# Patient Record
Sex: Female | Born: 1945 | Race: Black or African American | Hispanic: No | Smoking: Never smoker
Health system: Southern US, Community
[De-identification: ages and names within clinical notes are randomized; demographics above are authoritative.]

## PROBLEM LIST (undated history)

## (undated) DIAGNOSIS — H409 Unspecified glaucoma: Secondary | ICD-10-CM

## (undated) DIAGNOSIS — E119 Type 2 diabetes mellitus without complications: Secondary | ICD-10-CM

## (undated) DIAGNOSIS — I10 Essential (primary) hypertension: Secondary | ICD-10-CM

## (undated) HISTORY — DX: Type 2 diabetes mellitus without complications: E11.9

## (undated) HISTORY — PX: CATARACT EXTRACTION, BILATERAL: SHX1313

## (undated) HISTORY — PX: ABDOMINAL HYSTERECTOMY: SHX81

## (undated) HISTORY — DX: Unspecified glaucoma: H40.9

## (undated) HISTORY — DX: Essential (primary) hypertension: I10

---

## 1999-08-14 ENCOUNTER — Other Ambulatory Visit: Admission: RE | Admit: 1999-08-14 | Discharge: 1999-08-14 | Payer: Self-pay | Admitting: Obstetrics and Gynecology

## 1999-09-26 ENCOUNTER — Encounter (INDEPENDENT_AMBULATORY_CARE_PROVIDER_SITE_OTHER): Payer: Self-pay | Admitting: Specialist

## 1999-09-26 ENCOUNTER — Other Ambulatory Visit: Admission: RE | Admit: 1999-09-26 | Discharge: 1999-09-26 | Payer: Self-pay | Admitting: Internal Medicine

## 1999-11-29 ENCOUNTER — Encounter: Payer: Self-pay | Admitting: Family Medicine

## 1999-11-29 ENCOUNTER — Ambulatory Visit (HOSPITAL_COMMUNITY): Admission: RE | Admit: 1999-11-29 | Discharge: 1999-11-29 | Payer: Self-pay | Admitting: Family Medicine

## 2000-04-21 ENCOUNTER — Encounter: Payer: Self-pay | Admitting: Family Medicine

## 2000-04-21 ENCOUNTER — Ambulatory Visit (HOSPITAL_COMMUNITY): Admission: RE | Admit: 2000-04-21 | Discharge: 2000-04-21 | Payer: Self-pay | Admitting: Family Medicine

## 2000-12-07 ENCOUNTER — Other Ambulatory Visit: Admission: RE | Admit: 2000-12-07 | Discharge: 2000-12-07 | Payer: Self-pay | Admitting: *Deleted

## 2001-12-29 ENCOUNTER — Other Ambulatory Visit: Admission: RE | Admit: 2001-12-29 | Discharge: 2001-12-29 | Payer: Self-pay | Admitting: Obstetrics and Gynecology

## 2002-02-09 ENCOUNTER — Encounter: Payer: Self-pay | Admitting: Family Medicine

## 2002-02-09 ENCOUNTER — Encounter: Admission: RE | Admit: 2002-02-09 | Discharge: 2002-02-09 | Payer: Self-pay | Admitting: Family Medicine

## 2003-12-12 ENCOUNTER — Other Ambulatory Visit: Admission: RE | Admit: 2003-12-12 | Discharge: 2003-12-12 | Payer: Self-pay | Admitting: Obstetrics and Gynecology

## 2004-09-02 ENCOUNTER — Encounter: Admission: RE | Admit: 2004-09-02 | Discharge: 2004-09-02 | Payer: Self-pay | Admitting: Family Medicine

## 2004-10-18 ENCOUNTER — Ambulatory Visit: Payer: Self-pay | Admitting: Internal Medicine

## 2004-10-29 ENCOUNTER — Ambulatory Visit: Payer: Self-pay | Admitting: Internal Medicine

## 2005-01-29 ENCOUNTER — Encounter: Admission: RE | Admit: 2005-01-29 | Discharge: 2005-04-29 | Payer: Self-pay | Admitting: Family Medicine

## 2006-06-02 ENCOUNTER — Encounter: Admission: RE | Admit: 2006-06-02 | Discharge: 2006-06-02 | Payer: Self-pay | Admitting: Family Medicine

## 2006-06-04 ENCOUNTER — Encounter: Admission: RE | Admit: 2006-06-04 | Discharge: 2006-06-04 | Payer: Self-pay | Admitting: Family Medicine

## 2007-03-15 ENCOUNTER — Encounter: Admission: RE | Admit: 2007-03-15 | Discharge: 2007-03-15 | Payer: Self-pay | Admitting: Family Medicine

## 2007-04-21 ENCOUNTER — Ambulatory Visit (HOSPITAL_COMMUNITY): Admission: RE | Admit: 2007-04-21 | Discharge: 2007-04-21 | Payer: Self-pay | Admitting: Gastroenterology

## 2007-04-21 ENCOUNTER — Encounter (INDEPENDENT_AMBULATORY_CARE_PROVIDER_SITE_OTHER): Payer: Self-pay | Admitting: Gastroenterology

## 2007-05-24 ENCOUNTER — Encounter: Admission: RE | Admit: 2007-05-24 | Discharge: 2007-05-24 | Payer: Self-pay | Admitting: Obstetrics and Gynecology

## 2007-05-26 ENCOUNTER — Ambulatory Visit (HOSPITAL_COMMUNITY): Admission: RE | Admit: 2007-05-26 | Discharge: 2007-05-27 | Payer: Self-pay | Admitting: Obstetrics and Gynecology

## 2007-05-26 ENCOUNTER — Encounter (INDEPENDENT_AMBULATORY_CARE_PROVIDER_SITE_OTHER): Payer: Self-pay | Admitting: Obstetrics and Gynecology

## 2010-12-01 ENCOUNTER — Encounter: Payer: Self-pay | Admitting: Family Medicine

## 2010-12-01 ENCOUNTER — Encounter: Payer: Self-pay | Admitting: Obstetrics and Gynecology

## 2011-03-25 NOTE — Op Note (Signed)
NAMEAMELIANNA, Ashlee Wong         ACCOUNT NO.:  [id number]   MEDICAL RECORD NO.:  [id number]          PATIENT TYPE:  OIB   LOCATION:  9315                          FACILITY:  [facility]   PHYSICIAN:  Maxie Better, M.D.DATE OF BIRTH:  14-May-1946   DATE OF PROCEDURE:  05/26/2007  DATE OF DISCHARGE:                               OPERATIVE REPORT   PREOPERATIVE DIAGNOSIS:  Chronic left lower quadrant pain,  uterine  fibroids.   PROCEDURE:  Laparoscopic total hysterectomy with vaginal closure, and  repair of vaginal laceration.   POSTOPERATIVE DIAGNOSIS:  Chronic left lower quadrant pain, uterine  fibroids.   ANESTHESIA:  General.   SURGEON:  Maxie Better, M.D.   ASSISTANT:  Gerri Spore B. Earlene Plater, M.D.   PROCEDURE:  Under adequate general anesthesia the patient is placed in  the dorsal lithotomy position.  She was sterilely prepped and draped in  usual fashion.  An indwelling Foley catheter was placed.  Examination  under anesthesia revealed a slightly enlarged anteverted uterus,  irregular, no adnexal masses could be appreciated.  A bivalve speculum  placed in the vagina.  The vagina was atrophic, cervix that was a  pinpoint os, anterior lip of the cervix was grasped with a single-tooth  tenaculum.  The cervix was then serially dilated up to #23 dilator.  The  uterus was then sounded to 8 cm. The smallest Rumi cap for the cervix  was then placed and the balloon inflated intracavitary. The cervix was  placed within the Rumi, the uterine manipulator cap. The balloon was  then inflated in the vagina for seal. Attention was then turned to the  abdomen where an infraumbilical incision was then made, carried down to  the rectus fascia.  Rectus fascia was opened vertically and the parietal  peritoneum subsequently opened. Omental fat was encountered and  displaced. The Hasson cannula was introduced and a lighted video  laparoscope was put through that port.  The fascia was pursestring  with  0 Vicryl to facilitate the subsequent closure of the incision.  Inspection of the abdomen showed an atraumatic entry into the abdomen  itself. The liver edge was noted be normal. A 10 mm site was placed in  the left lower quadrant and in the right  lower side , the 5 mm port  incision was then made and a 5 mm port was placed.  All incisions had  been pre injected with quarter percent Marcaine. With this ports in  place the pelvis was inspected. Both ureters were seen to be  peristalsing deep in the pelvis.  Both ovaries and tubes were noted to  be normal.  The  uterus was irregular, consistent with fibroids.  The  patient had a previous cesarean section.  There was clearly evidence of  some adhesions of where the bladder reflection was.  The procedure was  started on the left. The infundibulopelvic ligament was then serially  cauterized and cut and subsequently the round ligament was cauterized  and severed. The anterior leaf of the bladder was then attempted to be  opened on the side there were adhesions noted and careful dissection was  continued.  On the right the round ligament was cauterized, cut. The  right infundibulopelvic ligament was also then serially cauterized and  cut until the uterine vessels were then reached. The bladder was tented.  Sharp dissection and cauterization was then used to try to open the  displace the bladder inferiorly and this was performed. Much adhesions  scar tissue was noted on the left half of the bladder flap.  The uterine  vessels were subsequently identified on the right, clamped, cauterized  and then severed. The bladder was placed and displaced inferiorly.  The  part of the anterior cul-de-sac was then opened with evidence of the  Rumi being seen and subsequently the left side of the bladder peritoneum  was cauterized, cut and dissected. In the process the uterine vessels on  the left for then also cauterized.  The hot scissors was then  utilized  to further separate the cervical attachment to the vagina posteriorly  and then the uterus was then pulled into the top of the vagina.  Attention was then turned back to the vagina where the Rumi apparatus  was removed.  The cervix was then grasped with single-tooth tenaculum  x2, placed on traction. There was evidence that the posterior aspect of  the vagina had a zigzag laceration thought to be probably secondary to  the narrowness of the vagina combined with the atrophic vaginitis  leading to trauma when the manipulator was utilized.  Nonetheless the  uterus was grasped, bivalved and the fibroids removed from the  intracavitary area and continued removal of the fibroids until the  uterus was decompressed and then was able to be pulled out along with  the tubes and ovaries bilaterally.  Decision was made since I was there  to then close the vaginal cuff since it was easily identified.  It was  closed with interrupted figure-of-eight sutures transversely.  The  posterior laceration was inspected.  Did not show evidence of the rectum  being compromised and therefore 2-0 Vicryl sutures were then placed to  approximate the defect.  Because of the atrophic changes in the vagina,  estrogen cream and a vaginal pack was then placed in the vagina.  Attention was then turned back to the abdomen which was reinsufflated.  Abdomen was irrigated, suctioned.  Good hemostasis noted.  Small  bleeders cauterized.  The abdomen was deflated in order to assess for  any bleeders.  None was seen and at that point the procedure was said to  be complete. The lower ports were removed under direct visualization.  The upper port was also removed under direct visualization.  The  pursestring of the rectus fascia was closed infraumbilically and the  skin at the incisions were approximated with 4-0 Vicryl subcuticular  stitches.   SPECIMENS:  Uterus with the fibroid, cervix, tubes and ovaries sent to   pathology.   ESTIMATED BLOOD LOSS:  Was 150 mL.   COMPLICATION:  Was the vaginal laceration.   INTRAOPERATIVE FLUID:  Was 2800 mL.   URINE OUTPUT:  Was 300 mL clear yellow urine.  Sponge and instruments  counts x2 was correct. The patient tolerated the procedure well and was  transferred to recovery in stable condition.      Maxie Better, M.D.  Electronically Signed     [place]/MEDQ  D:  05/26/2007  T:  05/27/2007  Job:  [id number]

## 2011-03-25 NOTE — Op Note (Signed)
Ashlee Wong, Ashlee Wong         ACCOUNT NO.:  [id number]   MEDICAL RECORD NO.:  [id number]          PATIENT TYPE:  AMB   LOCATION:  ENDO                         FACILITY:  [facility]   PHYSICIAN:  Anselmo Rod, M.D.  DATE OF BIRTH:  [birth year]   DATE OF PROCEDURE:  04/21/2007  DATE OF DISCHARGE:                               OPERATIVE REPORT   PROCEDURE PERFORMED:  Colonoscopy with multiple cold biopsies.   ENDOSCOPIST:  Anselmo Rod, M.D.   INSTRUMENT USED:  Pentax video colonoscopy.   INDICATION FOR PROCEDURE:  [age]-year-old African American female  undergoing screening colonoscopy.  The patient has a history of left  lower quadrant pain, rule out colon polyps, masses, etc.   PREPROCEDURE PREPARATION:  Informed consent was procured from the  patient.  The patient was fasted for 4 hours prior to the procedure and  prepped with MoviPrep the night of and on the morning of the procedure.  Risks and benefits of the procedure including a 10% miss rate of cancer  in polyps were discussed with the patient as well.   PREPROCEDURE PHYSICAL:  VITAL SIGNS:  The patient had stable vital  signs.  NECK:  Supple.  CHEST:  Clear to auscultation.  CARDIAC:  S1 and S2 regular.  ABDOMEN:  Soft with normal bowel sounds.   DESCRIPTION OF THE PROCEDURE:  The patient was placed in the left  lateral decubitus position and sedated with 100 mcg of Fentanyl and 10  mg of Versed given intravenously in slow incremental doses.  Once the  patient was adequately sedate and maintained on low-flow oxygen and  continuous cardiac monitoring, the Olympus video colonoscope was  advanced from the rectum to the cecum.  Whitish plaque-like lesions were  seen in the cecum and the proximal right colon of unclear significance.  Another small sessile polyp was biopsied from the mid right colon.  There was no evidence of diverticulosis.  Retroflexion in the rectum  revealed no abnormalities.  The patient tolerated  the procedure well  without immediate complications.   IMPRESSION:  1. Whitish plaques in proximal right colon and cecum, multiple      biopsies done, results pending.  2. A small sessile polyp biopsied from the mid right colon.  3. No evidence of diverticulosis.   RECOMMENDATIONS:  1. Await pathology results.  2. Avoid all nonsteroidals including aspirin for the next 4 weeks.  3. Outpatient followup in the next 2 weeks for further      recommendations.      Anselmo Rod, M.D.  Electronically Signed     JNM/MEDQ  D:  04/22/2007  T:  04/23/2007  Job:  [id number]   cc:   Maxie Better, M.D.  Fax: [phone]   Renaye Rakers, M.D.  Fax: [phone]

## 2011-06-10 ENCOUNTER — Other Ambulatory Visit (HOSPITAL_COMMUNITY): Payer: Self-pay | Admitting: Family Medicine

## 2011-06-10 DIAGNOSIS — J392 Other diseases of pharynx: Secondary | ICD-10-CM

## 2011-06-19 ENCOUNTER — Encounter (HOSPITAL_COMMUNITY)
Admission: RE | Admit: 2011-06-19 | Discharge: 2011-06-19 | Disposition: A | Discharge status: Home / Self Care | Payer: BC Managed Care – PPO | Source: Ambulatory Visit | Attending: Family Medicine | Admitting: Family Medicine

## 2011-06-19 DIAGNOSIS — J392 Other diseases of pharynx: Secondary | ICD-10-CM | POA: Insufficient documentation

## 2011-06-20 ENCOUNTER — Encounter (HOSPITAL_COMMUNITY)
Admission: RE | Admit: 2011-06-20 | Discharge: 2011-06-20 | Disposition: A | Discharge status: Home / Self Care | Payer: BC Managed Care – PPO | Source: Ambulatory Visit | Attending: Family Medicine | Admitting: Family Medicine

## 2011-06-20 DIAGNOSIS — R220 Localized swelling, mass and lump, head: Secondary | ICD-10-CM | POA: Insufficient documentation

## 2011-06-20 MED ORDER — SODIUM PERTECHNETATE TC 99M INJECTION
10.0000 | Freq: Once | INTRAVENOUS | Status: AC | PRN
  Administered 2011-06-20: 11 via INTRAVENOUS

## 2011-06-20 MED ORDER — SODIUM IODIDE I 131 CAPSULE
12.0600 | Freq: Once | INTRAVENOUS | Status: AC | PRN
  Administered 2011-06-19: 12.06 via ORAL

## 2011-07-23 ENCOUNTER — Other Ambulatory Visit (HOSPITAL_COMMUNITY): Payer: Self-pay | Admitting: Family Medicine

## 2011-07-23 DIAGNOSIS — E049 Nontoxic goiter, unspecified: Secondary | ICD-10-CM

## 2011-07-29 ENCOUNTER — Ambulatory Visit (HOSPITAL_COMMUNITY)
Admission: RE | Admit: 2011-07-29 | Discharge: 2011-07-29 | Disposition: A | Discharge status: Home / Self Care | Payer: BC Managed Care – PPO | Source: Ambulatory Visit | Attending: Family Medicine | Admitting: Family Medicine

## 2011-07-29 DIAGNOSIS — R220 Localized swelling, mass and lump, head: Secondary | ICD-10-CM | POA: Insufficient documentation

## 2011-07-29 DIAGNOSIS — E049 Nontoxic goiter, unspecified: Secondary | ICD-10-CM

## 2011-08-25 ENCOUNTER — Other Ambulatory Visit: Payer: Self-pay | Admitting: Family Medicine

## 2011-08-25 DIAGNOSIS — E041 Nontoxic single thyroid nodule: Secondary | ICD-10-CM

## 2011-08-25 LAB — BASIC METABOLIC PANEL
BUN: 6
CO2: 28
Calcium: 8.6
Creatinine, Ser: 0.56
GFR calc Af Amer: >60
GFR calc non Af Amer: >60

## 2011-08-25 LAB — CBC
Hemoglobin: 11.2 — ABNORMAL LOW
MCHC: 33.9
Platelets: 343
WBC: 15.2 — ABNORMAL HIGH

## 2011-08-26 LAB — CBC: WBC: 7

## 2011-08-27 ENCOUNTER — Other Ambulatory Visit (HOSPITAL_COMMUNITY)
Admission: RE | Admit: 2011-08-27 | Discharge: 2011-08-27 | Disposition: A | Discharge status: Home / Self Care | Payer: BC Managed Care – PPO | Source: Ambulatory Visit | Attending: Interventional Radiology | Admitting: Interventional Radiology

## 2011-08-27 ENCOUNTER — Ambulatory Visit
Admission: RE | Admit: 2011-08-27 | Discharge: 2011-08-27 | Disposition: A | Discharge status: Home / Self Care | Payer: BC Managed Care – PPO | Source: Ambulatory Visit | Attending: Family Medicine | Admitting: Family Medicine

## 2011-08-27 DIAGNOSIS — E041 Nontoxic single thyroid nodule: Secondary | ICD-10-CM

## 2011-08-27 DIAGNOSIS — E049 Nontoxic goiter, unspecified: Secondary | ICD-10-CM | POA: Insufficient documentation

## 2011-12-03 ENCOUNTER — Encounter: Payer: Self-pay | Admitting: Internal Medicine

## 2012-04-20 ENCOUNTER — Other Ambulatory Visit: Payer: Self-pay | Admitting: Internal Medicine

## 2012-04-20 DIAGNOSIS — E041 Nontoxic single thyroid nodule: Secondary | ICD-10-CM

## 2012-04-26 ENCOUNTER — Ambulatory Visit (HOSPITAL_COMMUNITY)
Admission: RE | Admit: 2012-04-26 | Discharge: 2012-04-26 | Disposition: A | Discharge status: Home / Self Care | Payer: Medicare Other | Source: Ambulatory Visit | Attending: Internal Medicine | Admitting: Internal Medicine

## 2012-04-26 DIAGNOSIS — E049 Nontoxic goiter, unspecified: Secondary | ICD-10-CM | POA: Insufficient documentation

## 2012-04-26 DIAGNOSIS — E041 Nontoxic single thyroid nodule: Secondary | ICD-10-CM

## 2012-06-20 ENCOUNTER — Ambulatory Visit (HOSPITAL_BASED_OUTPATIENT_CLINIC_OR_DEPARTMENT_OTHER): Payer: Medicare Other | Attending: Cardiology

## 2012-06-20 VITALS — Ht 67.0 in | Wt 193.0 lb

## 2012-06-20 DIAGNOSIS — G4733 Obstructive sleep apnea (adult) (pediatric): Secondary | ICD-10-CM

## 2012-06-20 DIAGNOSIS — R0989 Other specified symptoms and signs involving the circulatory and respiratory systems: Secondary | ICD-10-CM | POA: Insufficient documentation

## 2012-06-20 DIAGNOSIS — R0609 Other forms of dyspnea: Secondary | ICD-10-CM | POA: Insufficient documentation

## 2012-06-20 DIAGNOSIS — G4710 Hypersomnia, unspecified: Secondary | ICD-10-CM | POA: Insufficient documentation

## 2012-06-27 DIAGNOSIS — R0609 Other forms of dyspnea: Secondary | ICD-10-CM

## 2012-06-27 DIAGNOSIS — R0989 Other specified symptoms and signs involving the circulatory and respiratory systems: Secondary | ICD-10-CM

## 2012-06-27 DIAGNOSIS — G4710 Hypersomnia, unspecified: Secondary | ICD-10-CM

## 2012-06-27 DIAGNOSIS — G4730 Sleep apnea, unspecified: Secondary | ICD-10-CM

## 2012-06-28 NOTE — Procedures (Signed)
NAME:  Ashlee Wong, Ashlee Wong     ACCOUNT NO.:  [id number]  MEDICAL RECORD NO.:  [id number]          PATIENT TYPE:  OUT  LOCATION:  SLEEP CENTER                 FACILITY:  [facility]  PHYSICIAN:  Clinton D. Maple Hudson, MD, FCCP, FACPDATE OF BIRTH:  [birth year] 04, 1946  DATE OF STUDY:                           NOCTURNAL POLYSOMNOGRAM  REFERRING PHYSICIAN:  Pamella Pert, MD  INDICATION FOR STUDY:  Insomnia with sleep apnea.  EPWORTH SLEEPINESS SCORE:  11/24.  BMI 30.2, weight 193 pounds, height 67 inches, neck 14 inches.  MEDICATIONS:  Home medications are charted and reviewed.  SLEEP ARCHITECTURE:  Total sleep time 358.5 minutes with sleep efficiency 84.9%.  Stage I was 8.5%.  Stage II 76.7%.  Stage III absent, REM 14.8% of total sleep time.  Sleep latency 25 minutes.  REM latency 102.5 minutes, awake after sleep onset 39.5 minutes.  Arousal index 9.4.  Bedtime Medication:  None.  RESPIRATORY DATA:  Apnea-hypopnea index (AHI) 2.5 per hour.  A total of 15 events were scored, including 12 obstructive apneas, 2 central apneas, 1 hypopnea.  Events were associated with non-supine sleep position and REM.  REM AHI 10.2 per hour.  There were insufficient numbers of events to permit application of split protocol, CPAP titration on this study night.  OXYGEN DATA:  Moderately loud snoring with oxygen desaturation to a nadir of 85% and mean oxygen saturation through the study of 92.9% on room air.  CARDIAC DATA:  Sinus rhythm.  MOVEMENT-PARASOMNIA:  A total of 32 limb jerks were counted, of which 4 were associated with arousals or awakenings for periodic limb movement with arousal index of 0.7 per hour.  No bathroom trips.  IMPRESSION-RECOMMENDATIONS: 1. Occasional respiratory event with sleep disturbance, within normal     limits.  AHI 2.5 per hour (the normal range for adults is from 0-5     events per hour).  Moderately loud snoring with oxygen desaturation     to a nadir of 85% and mean  oxygen saturation through the study of     92.9% on room air. 2. There were not enough respiratory events on this study night to     qualify for application of split CPAP titration protocol, and     scores in this range would not usually be treated with CPAP.     Clinton D. Maple Hudson, MD, [facility], FACP Diplomate, American Board of Sleep Medicine    CDY/MEDQ  D:  06/27/2012 13:38:35  T:  06/28/2012 03:54:29  Job:  [id number]

## 2012-07-22 ENCOUNTER — Encounter: Payer: Self-pay | Admitting: Pulmonary Disease

## 2012-07-22 ENCOUNTER — Ambulatory Visit (INDEPENDENT_AMBULATORY_CARE_PROVIDER_SITE_OTHER): Payer: Medicare Other | Admitting: Pulmonary Disease

## 2012-07-22 VITALS — BP 110/56 | HR 58 | Temp 97.6°F | Ht 67.0 in | Wt 193.2 lb

## 2012-07-22 DIAGNOSIS — G4730 Sleep apnea, unspecified: Secondary | ICD-10-CM

## 2012-07-22 DIAGNOSIS — Z23 Encounter for immunization: Secondary | ICD-10-CM

## 2012-07-22 NOTE — Assessment & Plan Note (Signed)
She has snoring, sleep disruption, and daytime sleepiness.  She has a history of hypertension and diabetes.  While her overall AHI was in normal range, she did have elevated AHI in REM sleep.    I have reviewed the sleep test results with the patient.  Explained how sleep apnea can affect the patient's health.  Driving precautions and importance of weight loss were discussed.  Treatment options for sleep apnea were reviewed.  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  She would like to try weight reduction first.  Will f/u in 6 months.  Will then decide if she needs to pursue specific therapy for sleep apnea.

## 2012-07-22 NOTE — Progress Notes (Deleted)
  Subjective:    Patient ID: MATRICIA BEGNAUD, female    DOB: 07/01/1946, 66 y.o.   MRN: [id number]  HPI    Review of Systems  Constitutional: Negative for fever and unexpected weight change.  HENT: Positive for ear pain and dental problem. Negative for congestion, sore throat, sneezing and trouble swallowing.   Respiratory: Positive for shortness of breath. Negative for cough.   Cardiovascular: Negative for chest pain, palpitations and leg swelling.  Gastrointestinal: Negative for abdominal pain.  Musculoskeletal: Negative for joint swelling.  Skin: Negative for rash.  Neurological: Negative for headaches.  Psychiatric/Behavioral: Positive for dysphoric mood. The patient is not nervous/anxious.        Objective:   Physical Exam        Assessment & Plan:

## 2012-07-22 NOTE — Patient Instructions (Signed)
Follow up in 6 months 

## 2012-07-22 NOTE — Progress Notes (Signed)
Chief Complaint  Patient presents with  . Advice Only    Had sleep srudy aug 2013- never received the results.    CC: Yates Decamp  History of Present Illness: Ashlee Wong is a 66 y.o. female for evaluation of sleep problems.  She had sleep study 8/11//13>>AHI 2.5, SpO2 85%, PLMI 0.7, REM AHI 10.2.  She did not have supine sleep during the study.  She has noticed trouble with her sleep for years.  This has been getting worse.  She currently lives alone.  She has been told that she snores.    She goes to bed at 10 pm, but watches TV for about an hour in bed.  She will then sleep until about 3 am.  She wakes up to use the bathroom, but then won't fall back to sleep for another hour.  She gets out of bed at 6 am.  She feels tired in the morning, but denies headaches.  She is not using anything to help her sleep or stay awake.  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  She was told she has a thyroid nodule.  She denies surgery on her nose or tonsils.  There is no history of smoking or alcohol use.  She can fall asleep easily if she is sitting quiet.  The patient denies sleep walking, sleep talking, bruxism, or nightmares.  There is no history of restless legs.  The patient denies sleep hallucinations, sleep paralysis, or cataplexy.  Her Epworth score is 12 out of 24.  Past Medical History  Diagnosis Date  . Glaucoma   . Hypertension   . Type 2 diabetes mellitus     Past Surgical History  Procedure Date  . Abdominal hysterectomy   . Cataract extraction, bilateral     Current Outpatient Prescriptions on File Prior to Visit  Medication Sig Dispense Refill  . linagliptin (TRADJENTA) 5 MG TABS tablet Take 5 mg by mouth daily.      . nebivolol (BYSTOLIC) 5 MG tablet Take 5 mg by mouth daily.        No Known Allergies  Family History  Problem Relation Age of Onset  . Heart disease Father   . Cancer Mother     History  Substance Use Topics  .  Smoking status: Never Smoker   . Smokeless tobacco: Not on file  . Alcohol Use: No    Review of Systems  Constitutional: Negative for fever and unexpected weight change.  HENT: Positive for ear pain and dental problem. Negative for congestion, sore throat, sneezing and trouble swallowing.   Respiratory: Positive for shortness of breath. Negative for cough.  Cardiovascular: Negative for chest pain, palpitations and leg swelling.  Gastrointestinal: Negative for abdominal pain.  Musculoskeletal: Negative for joint swelling.  Skin: Negative for rash.  Neurological: Negative for headaches.  Psychiatric/Behavioral: Positive for dysphoric mood. The patient is not nervous/anxious.    Physical Exam: Filed Vitals:   07/22/12 1354 07/22/12 1356  BP:  110/56  Pulse:  58  Temp: 97.6 F (36.4 C)   TempSrc: Oral   Height: 5\' 7"  (1.702 m)   Weight: 193 lb 3.2 oz (87.635 kg)   SpO2:  96%  ,  Current Encounter SPO2  07/22/12 1356 96%    Wt Readings from Last 3 Encounters:  07/22/12 193 lb 3.2 oz (87.635 kg)  06/20/12 193 lb (87.544 kg)    Body mass index is 30.26 kg/(m^2).   General - No  distress ENT - TM clear, no sinus tenderness, no oral exudate, MP 4, scalloped tongue border, no LAN, no thyromegaly Cardiac - s1s2 regular, no murmur, pulses symmetric, no edema Chest - normal respiratory excursion, good air entry, no wheeze/rales/dullness Back - no focal tenderness Abd - soft, non-tender, no organomegaly, + bowel sounds Ext - normal motor strength Neuro - Cranial nerves are normal. PERLA. EOM's intact. Skin - no discernible active dermatitis, erythema, urticaria or inflammatory process. Psych - normal mood, and behavior.   Lab Results  Component Value Date   WBC 15.2* 05/27/2007   HGB 11.2* 05/27/2007   HCT 33.0* 05/27/2007   MCV 81.9 05/27/2007   PLT 343 05/27/2007    Lab Results  Component Value Date   CREATININE 0.56 05/27/2007   BUN 6 05/27/2007   NA 139 05/27/2007   K  3.7 05/27/2007   CL 107 05/27/2007   CO2 28 05/27/2007     Assessment/Plan:  Coralyn Helling, MD [facility] Pager:  [phone] 07/22/2012, 2:05 PM

## 2012-07-29 ENCOUNTER — Encounter: Payer: Self-pay | Admitting: Internal Medicine

## 2013-01-18 ENCOUNTER — Telehealth: Payer: Self-pay | Admitting: Pulmonary Disease

## 2013-01-18 NOTE — Telephone Encounter (Signed)
Attempted to call pt x 3 to make next ov per recall.  Pt never returned calls.  Mailed recall letter 01/19/13. Emily E McAlister °

## 2013-04-28 IMAGING — US US SOFT TISSUE HEAD/NECK
1 series · 14 of 25 positions shown · non-contrast
Comparison: Scintigraphy 06/20/2011

CLINICAL DATA: Neck swelling.  Normal scintigraphy.

THYROID ULTRASOUND
TECHNIQUE: Ultrasound examination of the thyroid gland and adjacent
soft tissues was performed.

[Series 1: us soft tissue head/neck · 0.07mm/px · 14 of 66 slices shown]
[im 1/66]
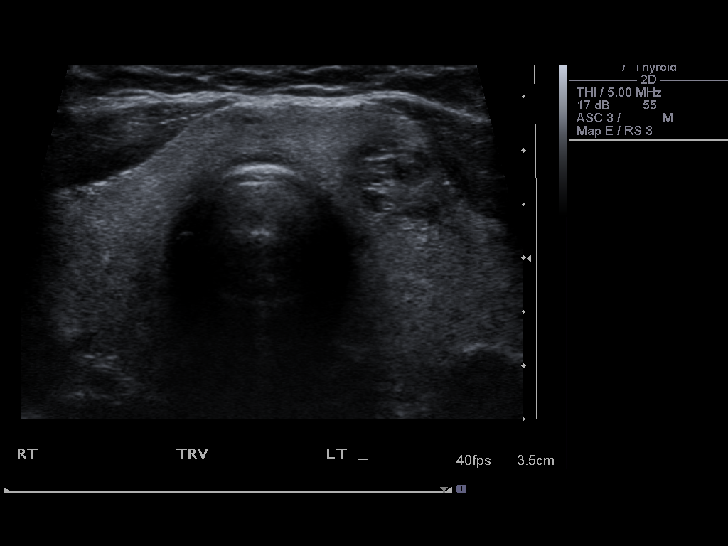
[im 6/66]
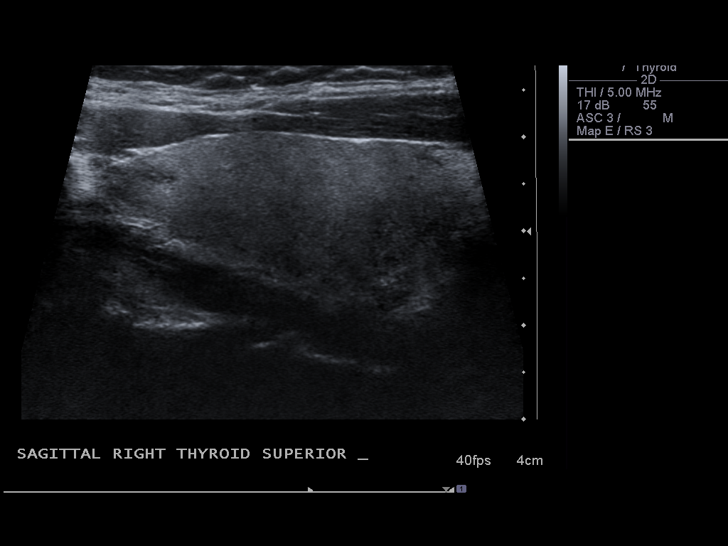
[im 11/66]
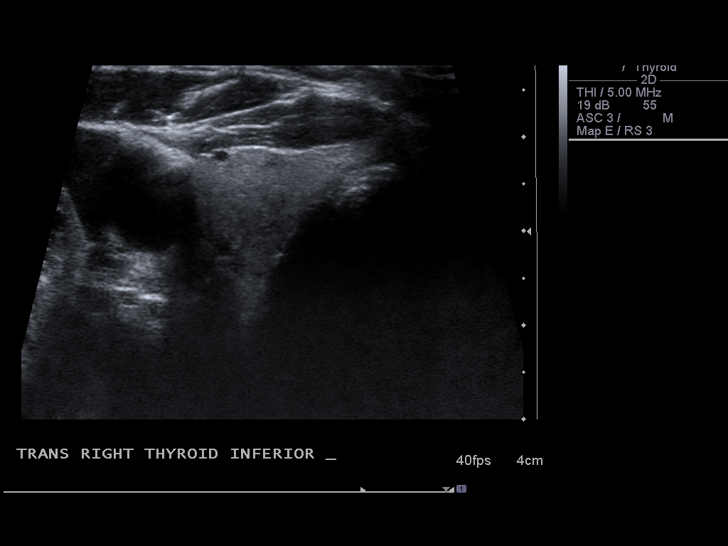
[im 17/66]
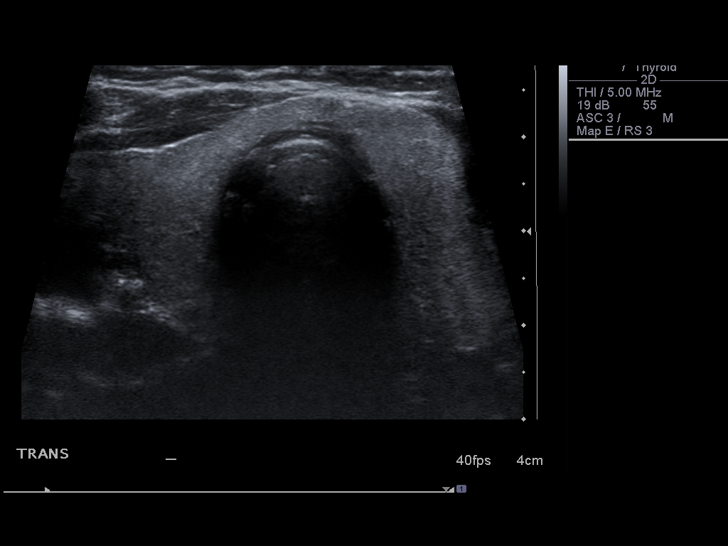
[im 22/66]
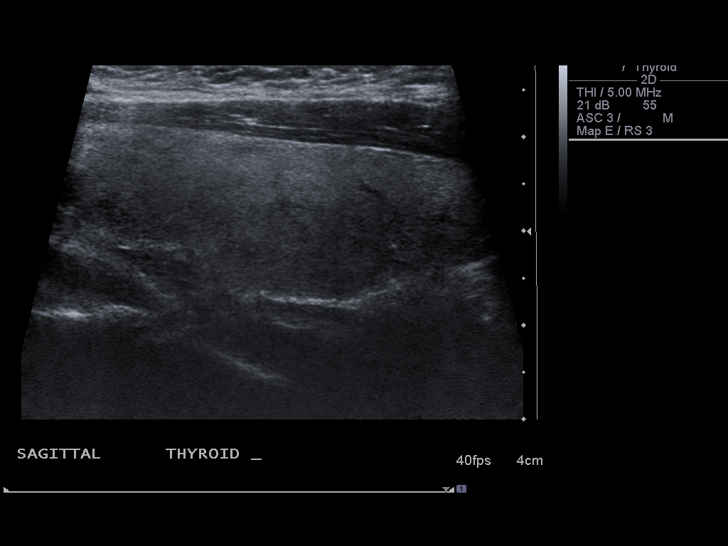
[im 25/66]
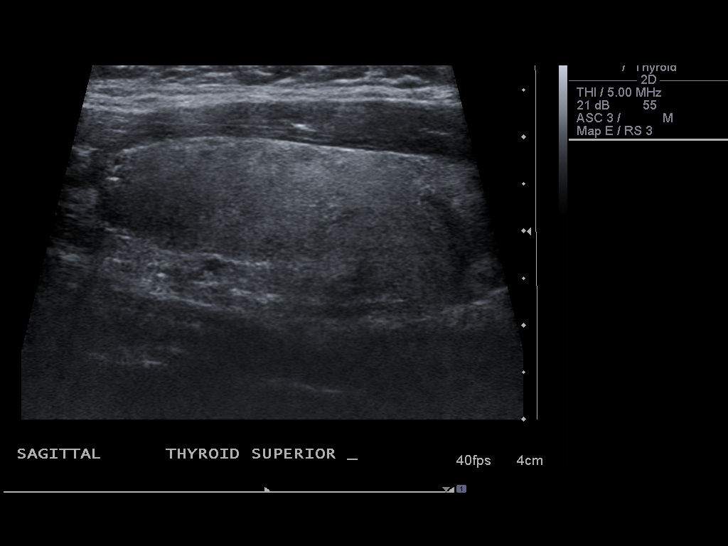
[im 30/66]
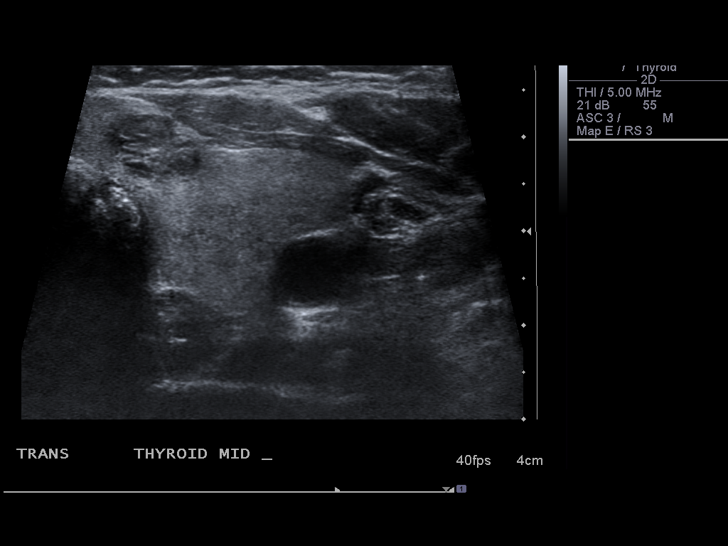
[im 36/66]
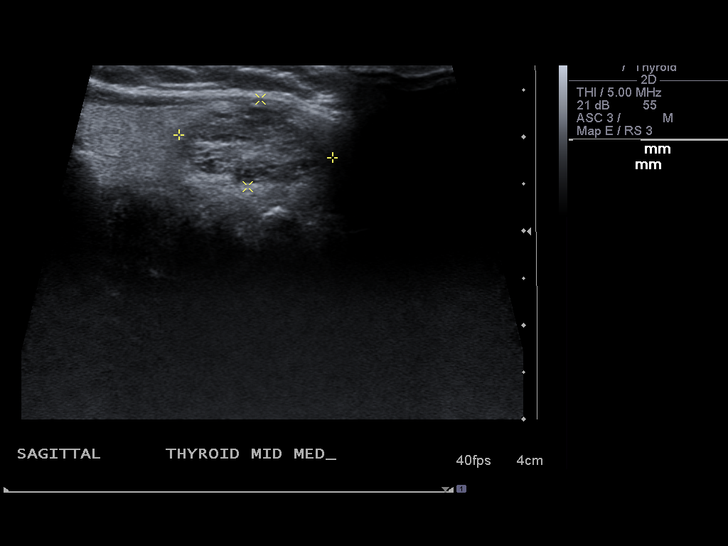
[im 41/66]
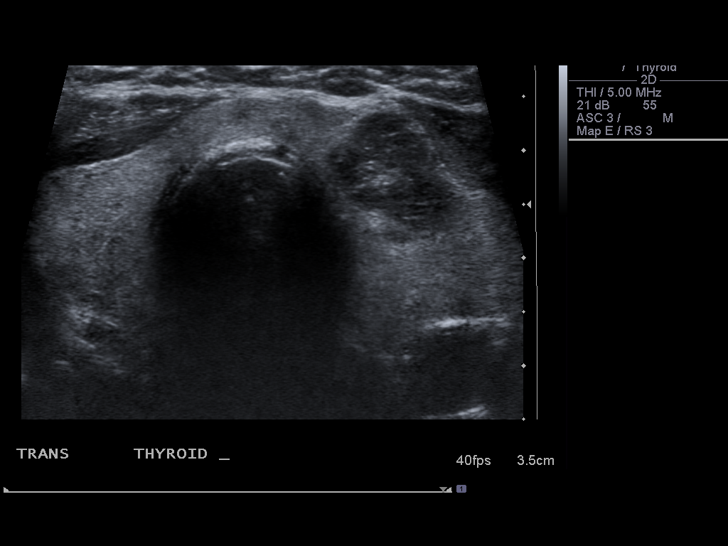
[im 44/66]
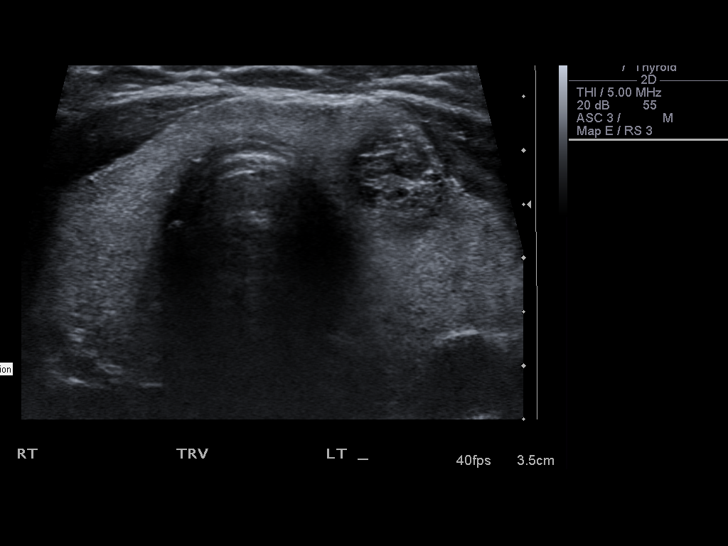
[im 49/66]
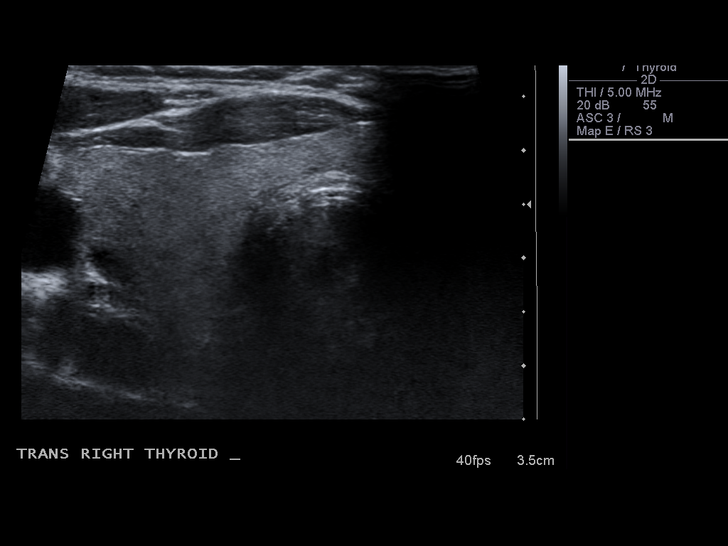
[im 55/66]
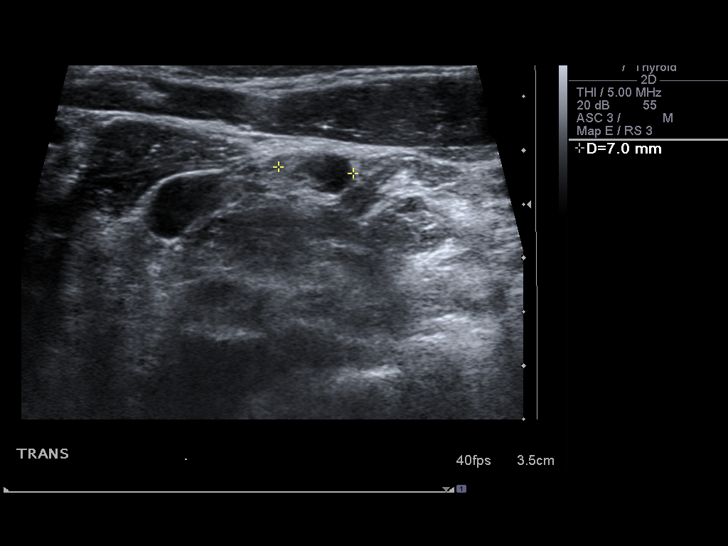
[im 60/66]
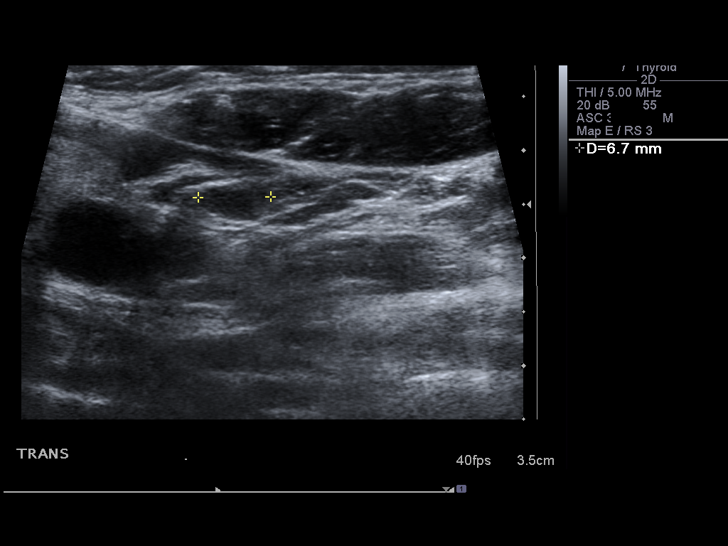
[im 66/66]
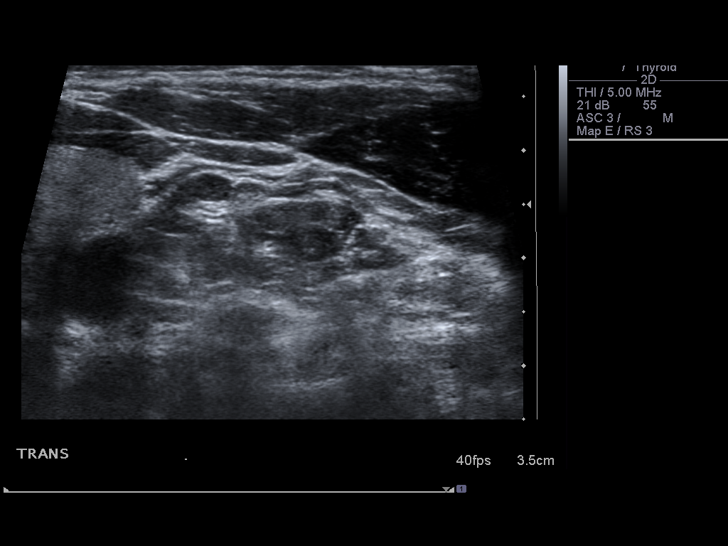

[14 of 25 positions shown; findings below may reference images not displayed]

FINDINGS: Right thyroid lobe:  14 x 17 x 41 mm, inhomogeneous
Left thyroid lobe:  17 x 23 x 48 mm
Isthmus:  4.4 mm in thickness

Focal nodules:
9 x 13 x 17 mm solid, medial left

Lymphadenopathy:  None visualized. Several sub centimeter normal
appearing cervical nodes are noted bilaterally.
IMPRESSION: 1.  17 mm solitary left thyroid nodule.  Consider FNA biopsy to
exclude neoplasm. This recommendation follows the consensus
statement:  Management of Thyroid Nodules Detected as US:  Society
of Radiologists in Ultrasound Consensus Conference Statement.
[URL]

## 2014-10-04 ENCOUNTER — Other Ambulatory Visit: Payer: Self-pay | Admitting: Family Medicine

## 2014-10-04 DIAGNOSIS — R609 Edema, unspecified: Secondary | ICD-10-CM

## 2014-10-04 DIAGNOSIS — M79671 Pain in right foot: Secondary | ICD-10-CM

## 2014-10-04 DIAGNOSIS — M79672 Pain in left foot: Principal | ICD-10-CM

## 2014-10-09 ENCOUNTER — Ambulatory Visit
Admission: RE | Admit: 2014-10-09 | Discharge: 2014-10-09 | Disposition: A | Discharge status: Home / Self Care | Payer: Medicare Other | Source: Ambulatory Visit | Attending: Family Medicine | Admitting: Family Medicine

## 2014-10-09 DIAGNOSIS — R609 Edema, unspecified: Secondary | ICD-10-CM

## 2014-10-09 DIAGNOSIS — M79672 Pain in left foot: Principal | ICD-10-CM

## 2014-10-09 DIAGNOSIS — M79671 Pain in right foot: Secondary | ICD-10-CM

## 2017-10-23 ENCOUNTER — Ambulatory Visit
Admission: RE | Admit: 2017-10-23 | Discharge: 2017-10-23 | Disposition: A | Discharge status: Home / Self Care | Payer: BC Managed Care – PPO | Source: Ambulatory Visit | Attending: Family Medicine | Admitting: Family Medicine

## 2017-10-23 ENCOUNTER — Other Ambulatory Visit: Payer: Self-pay | Admitting: Family Medicine

## 2017-10-23 DIAGNOSIS — R1084 Generalized abdominal pain: Secondary | ICD-10-CM

## 2017-10-23 MED ORDER — IOPAMIDOL (ISOVUE-300) INJECTION 61%
100.0000 mL | Freq: Once | INTRAVENOUS | Status: AC | PRN
  Administered 2017-10-23: 100 mL via INTRAVENOUS

## 2018-02-16 ENCOUNTER — Other Ambulatory Visit (HOSPITAL_COMMUNITY): Payer: Self-pay | Admitting: Family Medicine

## 2018-02-16 DIAGNOSIS — E079 Disorder of thyroid, unspecified: Secondary | ICD-10-CM

## 2018-02-24 ENCOUNTER — Encounter (HOSPITAL_COMMUNITY): Payer: BC Managed Care – PPO

## 2018-02-25 ENCOUNTER — Other Ambulatory Visit (HOSPITAL_COMMUNITY): Payer: BC Managed Care – PPO

## 2018-03-04 ENCOUNTER — Encounter (HOSPITAL_COMMUNITY)
Admission: RE | Admit: 2018-03-04 | Discharge: 2018-03-04 | Disposition: A | Discharge status: Home / Self Care | Payer: Medicare Other | Source: Ambulatory Visit | Attending: Family Medicine | Admitting: Family Medicine

## 2018-03-04 ENCOUNTER — Other Ambulatory Visit (HOSPITAL_COMMUNITY): Payer: Self-pay | Admitting: Family Medicine

## 2018-03-04 DIAGNOSIS — E079 Disorder of thyroid, unspecified: Secondary | ICD-10-CM | POA: Insufficient documentation

## 2018-03-04 MED ORDER — SODIUM IODIDE I 131 CAPSULE
15.4000 | Freq: Once | INTRAVENOUS | Status: AC | PRN
  Administered 2018-03-04: 15.4 via ORAL

## 2018-03-05 ENCOUNTER — Encounter (HOSPITAL_COMMUNITY)
Admission: RE | Admit: 2018-03-05 | Discharge: 2018-03-05 | Disposition: A | Discharge status: Home / Self Care | Payer: Medicare Other | Source: Ambulatory Visit | Attending: Family Medicine | Admitting: Family Medicine

## 2018-03-05 MED ORDER — SODIUM PERTECHNETATE TC 99M INJECTION
10.1000 | Freq: Once | INTRAVENOUS | Status: AC | PRN
  Administered 2018-03-05: 10.1 via INTRAVENOUS

## 2018-06-29 ENCOUNTER — Other Ambulatory Visit: Payer: Self-pay | Admitting: Neurosurgery

## 2018-06-29 DIAGNOSIS — D321 Benign neoplasm of spinal meninges: Secondary | ICD-10-CM

## 2019-12-12 ENCOUNTER — Other Ambulatory Visit: Payer: Self-pay | Admitting: Family Medicine

## 2019-12-12 ENCOUNTER — Ambulatory Visit
Admission: RE | Admit: 2019-12-12 | Discharge: 2019-12-12 | Disposition: A | Discharge status: Home / Self Care | Payer: Medicare Other | Source: Ambulatory Visit | Attending: Family Medicine | Admitting: Family Medicine

## 2019-12-12 ENCOUNTER — Other Ambulatory Visit: Payer: Self-pay

## 2019-12-12 DIAGNOSIS — M549 Dorsalgia, unspecified: Secondary | ICD-10-CM

## 2020-01-05 ENCOUNTER — Ambulatory Visit: Payer: BC Managed Care – PPO | Attending: Internal Medicine

## 2020-01-05 DIAGNOSIS — Z23 Encounter for immunization: Secondary | ICD-10-CM

## 2020-01-05 NOTE — Progress Notes (Signed)
   Covid-19 Vaccination Clinic  Name:  Ashlee Wong    MRN: [id number] DOB: 31-Oct-1946  01/05/2020  Ashlee Wong was observed post Covid-19 immunization for 15 minutes without incidence. She was provided with Vaccine Information Sheet and instruction to access the V-Safe system.   Ashlee Wong was instructed to call 911 with any severe reactions post vaccine: Marland Kitchen Difficulty breathing  . Swelling of your face and throat  . A fast heartbeat  . A bad rash all over your body  . Dizziness and weakness    Immunizations Administered    Name Date Dose VIS Date Route   Pfizer COVID-19 Vaccine 01/05/2020 11:18 AM 0.3 mL 10/21/2019 Intramuscular   Manufacturer: [place]   Lot: [id number]   [place]: [id number]

## 2020-01-28 ENCOUNTER — Ambulatory Visit: Payer: BC Managed Care – PPO | Attending: Internal Medicine

## 2020-01-28 DIAGNOSIS — Z23 Encounter for immunization: Secondary | ICD-10-CM

## 2020-01-28 NOTE — Progress Notes (Signed)
   Covid-19 Vaccination Clinic  Name:  Ashlee Wong    MRN: [id number] DOB: Aug 09, 1946  01/28/2020  [name] was observed post Covid-19 immunization for 15 minutes without incident. She was provided with Vaccine Information Sheet and instruction to access the V-Safe system.   [name] was instructed to call 911 with any severe reactions post vaccine: Marland Kitchen Difficulty breathing  . Swelling of face and throat  . A fast heartbeat  . A bad rash all over body  . Dizziness and weakness   Immunizations Administered    Name Date Dose VIS Date Route   Pfizer COVID-19 Vaccine 01/28/2020 10:45 AM 0.3 mL 10/21/2019 Intramuscular   Manufacturer: [place]   Lot: [id number]   [place]: [id number]

## 2020-01-31 ENCOUNTER — Ambulatory Visit: Payer: BC Managed Care – PPO

## 2020-05-03 ENCOUNTER — Ambulatory Visit: Payer: BC Managed Care – PPO | Admitting: Physical Therapy

## 2020-06-08 ENCOUNTER — Encounter: Payer: Self-pay | Admitting: Rehabilitative and Restorative Service Providers"

## 2020-06-08 ENCOUNTER — Ambulatory Visit: Payer: Medicare Other | Attending: Family Medicine | Admitting: Rehabilitative and Restorative Service Providers"

## 2020-06-08 ENCOUNTER — Other Ambulatory Visit: Payer: Self-pay

## 2020-06-08 DIAGNOSIS — R293 Abnormal posture: Secondary | ICD-10-CM | POA: Insufficient documentation

## 2020-06-08 DIAGNOSIS — G8929 Other chronic pain: Secondary | ICD-10-CM | POA: Insufficient documentation

## 2020-06-08 DIAGNOSIS — M545 Low back pain: Secondary | ICD-10-CM | POA: Insufficient documentation

## 2020-06-08 NOTE — Patient Instructions (Signed)
Access Code: [email or web address] URL: [email or web address] Date: 06/08/2020 Prepared by: Myra Rude  Exercises Seated Scapular Retraction - 2 x daily - 7 x weekly - 1 sets - 20 reps Doorway Pec Stretch at 60 Elevation - 2 x daily - 7 x weekly - 1 sets - 3 reps - 30 sec hold Supine Posterior Pelvic Tilt - 2 x daily - 7 x weekly - 1 sets - 20 reps Standing Posterior Pelvic Tilt - 2 x daily - 7 x weekly - 1 sets - 20 reps

## 2020-06-08 NOTE — Therapy (Signed)
[place], [name], [address] Phone: [phone]   Fax:  [phone]  Physical Therapy Evaluation  Patient Details  Name: DELAINE HERNANDEZ MRN: [id number] Date of Birth: [birth year] Referring Provider (PT): Dr. Lucianne Lei   Encounter Date: 06/08/2020   PT End of Session - 06/08/20 0956    Visit Number 1    Number of Visits 12    Date for PT Re-Evaluation 07/20/20    Authorization Type BCBS SHP; BCBS MCR    Progress Note Due on Visit 10    PT Start Time [phone]    PT Stop Time 0941    PT Time Calculation (min) 59 min    Activity Tolerance Patient tolerated treatment well;No increased pain    Behavior During Therapy WFL for tasks assessed/performed           Past Medical History:  Diagnosis Date  . Glaucoma   . Hypertension   . Type 2 diabetes mellitus ([place])     Past Surgical History:  Procedure Laterality Date  . ABDOMINAL HYSTERECTOMY    . CATARACT EXTRACTION, BILATERAL      There were no vitals filed for this visit.    Subjective Assessment - 06/08/20 0840    Subjective Back aches at night alot; When I am in the car, I slump over. At the computr, I slump over and it really bothers me. At night, my back really hurts. Takes 2 tylenol at night 3x/week.    Pertinent History LBP history x 2 years    Limitations Sitting;House hold activities;Standing;Walking    How long can you sit comfortably? 15 min    How long can you stand comfortably? 15 min    How long can you walk comfortably? does not bother    Patient Stated Goals sit up straight    Currently in Pain? No/denies    Multiple Pain Sites No              [facility] PT Assessment - 06/08/20 0001      Assessment   Medical Diagnosis LBP    Referring Provider (PT) Dr. Lucianne Lei    Onset Date/Surgical Date 05/10/18    Hand Dominance Right    Next MD Visit 06/21/20    Prior Therapy none      Precautions   Precautions None       Restrictions   Weight Bearing Restrictions No      Balance Screen   Has the patient fallen in the past 6 months No      [place] residence      Prior Function   Level of Independence Independent      Cognition   Overall Cognitive Status Within Functional Limits for tasks assessed      Observation/Other Assessments   Focus on Therapeutic Outcomes (FOTO)  61% limited      Sensation   Light Touch Appears Intact      Coordination   Gross Motor Movements are Fluid and Coordinated Yes      Posture/Postural Control   Posture Comments slumped posture; sits asymmetrically; stands with increased lordosis, decreased WB R LE      ROM / Strength   AROM / PROM / Strength AROM;PROM;Strength      AROM   Overall AROM  Within functional limits for tasks performed;Other (comment)    Overall AROM Comments Lumbar AROM flex/ext/sidebending and rotation WNL; knee AROM WNL  PROM   Overall PROM Comments Hip PROM WNL and = bil      Strength   Strength Assessment Site Hip;Knee    Right/Left Hip Right;Left    Right Hip Flexion 3+/5    Right Hip Extension 3+/5    Right Hip ABduction 4+/5    Right Hip ADduction 4+/5    Left Hip Flexion 3+/5    Left Hip Extension 3+/5    Left Hip ABduction 4+/5    Left Hip ADduction 4+/5    Right/Left Knee Right;Left    Right Knee Flexion 4+/5    Right Knee Extension 5/5    Left Knee Flexion 4+/5    Left Knee Extension 5/5      Palpation   Palpation comment right thoracolumbar paraspinals tighter                      Objective measurements completed on examination: See above findings.               PT Education - 06/08/20 0954    Education Details educated pt on seating posture using computer and phone at her kitchen table and on proper standing posture with tilt and gentle scapular retract; supine positioning with 2 pillows intead of 4 under her head with a towel roll in her pillowcase  and 2 pillows under her knees. Told her if she isn't comfortable with just 2 pillows under she can do 3 but eventually she needs to decrease to 2.    Person(s) Educated Patient    Methods Explanation;Demonstration;Handout    Comprehension Verbalized understanding;Returned demonstration            PT Short Term Goals - 06/08/20 1004      PT SHORT TERM GOAL #1   Title Pt will be I with initial HEP    Baseline issued at eval    Time 2    Period Weeks    Status New    Target Date 06/22/20      PT SHORT TERM GOAL #2   Title Pt will be able to sit x 30 min with 50% less difficulty to use the computer or eat dinner    Baseline 52min    Time 2    Period Weeks    Status New    Target Date 06/22/20             PT Long Term Goals - 06/08/20 1005      PT LONG TERM GOAL #1   Title pt will report </= 3/10 LBP when resting at night    Baseline 7/10    Time 6    Period Weeks    Status New    Target Date 07/20/20      PT LONG TERM GOAL #2   Title foto score will be improve to 48% limitation    Baseline 61% limitation    Time 6    Period Weeks    Status New    Target Date 07/20/20      PT LONG TERM GOAL #3   Title Pt will be able to sit x 1 hour with < 3/10 LBP to use her phone or computer    Baseline up to 7/10    Time 6    Period Weeks    Status New    Target Date 07/20/20      PT LONG TERM GOAL #4   Title Pt will be able to stand x 30 min  with <3/10 LBP to perform ADLS    Baseline 15 min    Time 6    Period Weeks    Status New    Target Date 07/20/20                  Plan - 06/08/20 0958    Clinical Impression Statement Pt presents to PT with poor posture and with complaints of LBP. Pt stands with increased thoracic kyphosis and increased lumbar lordosis with unequal bil LE weightshift. She sits also with slumped posture with oblique casual sitting. She sits at the kitchen table to use her computer and reports  sitting with slumped posture to use her  phone. She states she leans forward slumped to use the computer. Pt would benefit from PT for scap retract therex, pec flexibility therex, and tilt/core therex to assist with decreasing pain and improving posture. She needs postural therex. She reports increasing discomfort at rest but laying on 4 pillows under her head at night.    Examination-Activity Limitations Lift;Stand;Carry;Sleep    Examination-Participation Restrictions Community Activity;Other   using her computer and phone   Stability/Clinical Decision Making Stable/Uncomplicated    Clinical Decision Making Low    Rehab Potential Excellent    PT Frequency 2x / week    PT Duration 6 weeks    PT Treatment/Interventions ADLs/Self Care Home Management;Moist Heat;Electrical Stimulation;Functional mobility training;Therapeutic exercise;Therapeutic activities;Patient/family education;Manual techniques;Taping    PT Next Visit Plan Review HEP; see how she did with sleeping with 2 pillows under her head with towel roll and 2 pillows under knees; progress tilt and scap strengthening therex; discuss with her more about sitting posture when not at computer and in regular chair with butt to the back of the chair    PT Home Exercise Plan Access Code: [email or web address]: [email or web address]: 07/30/2021Prepared by: Deborra Medina ArtisExercisesSeated Scapular Retraction - 2 x daily - 7 x weekly - 1 sets - 20 repsDoorway Pec Stretch at 60 Elevation - 2 x daily - 7 x weekly - 1 sets - 3 reps - 30 sec holdSupine Posterior Pelvic Tilt - 2 x daily - 7 x weekly - 1 sets - 20 repsStanding Posterior Pelvic Tilt - 2 x daily - 7 x weekly - 1 sets - 20 reps    Consulted and Agree with Plan of Care Patient           Patient will benefit from skilled therapeutic intervention in order to improve the following deficits and impairments:  Decreased mobility, Improper body mechanics, Pain, Postural dysfunction, Impaired flexibility, Decreased strength  Visit  Diagnosis: Chronic midline low back pain without sciatica  Abnormal posture     Problem List Patient Active Problem List   Diagnosis Date Noted  . Sleep apnea 07/22/2012    Myra Rude, PT 06/08/2020, 10:10 AM  [facility] [address] [place], [name], [address] Phone: [phone]   Fax:  [phone]  Name: KADIATOU OPLINGER MRN: [id number] Date of Birth: 1946/02/25

## 2020-06-13 ENCOUNTER — Ambulatory Visit: Payer: Medicare Other | Attending: Family Medicine | Admitting: Rehabilitative and Restorative Service Providers"

## 2020-06-13 ENCOUNTER — Encounter: Payer: Self-pay | Admitting: Rehabilitative and Restorative Service Providers"

## 2020-06-13 ENCOUNTER — Other Ambulatory Visit: Payer: Self-pay

## 2020-06-13 DIAGNOSIS — M545 Low back pain: Secondary | ICD-10-CM | POA: Insufficient documentation

## 2020-06-13 DIAGNOSIS — R293 Abnormal posture: Secondary | ICD-10-CM | POA: Diagnosis present

## 2020-06-13 DIAGNOSIS — G8929 Other chronic pain: Secondary | ICD-10-CM | POA: Insufficient documentation

## 2020-06-13 NOTE — Therapy (Signed)
[place], [name], [address] Phone: [phone]   Fax:  [phone]  Physical Therapy Treatment  Patient Details  Name: Ashlee Wong MRN: [id number] Date of Birth: Jul 15, 1946 Referring Provider (PT): Dr. Lucianne Lei   Encounter Date: 06/13/2020   PT End of Session - 06/13/20 1517    Visit Number 2    Number of Visits 12    Date for PT Re-Evaluation 07/20/20    Authorization Type BCBS SHP; BCBS MCR    PT Start Time 7654    PT Stop Time 0333    PT Time Calculation (min) 46 min    Activity Tolerance Patient tolerated treatment well;No increased pain    Behavior During Therapy WFL for tasks assessed/performed           Past Medical History:  Diagnosis Date  . Glaucoma   . Hypertension   . Type 2 diabetes mellitus ([place])     Past Surgical History:  Procedure Laterality Date  . ABDOMINAL HYSTERECTOMY    . CATARACT EXTRACTION, BILATERAL      There were no vitals filed for this visit.   Subjective Assessment - 06/13/20 1452    Subjective Doing yoga and Cardiosculpt at [facility]. The pillow under the knees is working well; I am down to 3 pillows under my head. the towel roll to help is doing great.    Currently in Pain? No/denies                             [facility] Adult PT Treatment/Exercise - 06/13/20 0001      Exercises   Exercises Neck;Shoulder;Lumbar      Neck Exercises: Supine   Other Supine Exercise chin tucks with neck on 2 pillow with pt initially using chest compensation to perform requiring max PT verbal and visual cues for technique x 20, iso chin tuck with scap retract x 15      Lumbar Exercises: Supine   Other Supine Lumbar Exercises tilt with SLR x 12 unilat bil; bridge x 20; iso bridge with yellow  band hip pull x 20; tilt with clam shell x 15; knee to chest stretch 2x30 sec each; lower trunk rotation x 5 with 5 sec hold      Shoulder Exercises: Supine    Other Supine Exercises tilt review with maximal PT visual and verbal cues for technique required for proper performance, tilt with yellow theraband chest pull x 15, with bil ER x 15; yellow band diagonal pull x 15 each direction      Shoulder Exercises: Seated   Other Seated Exercises UBE posterior level 1 x 5 min with PT verbal cues for scap retract and posture; reviewed scap retract with verbal/visual cues required for proper technique x 10      Shoulder Exercises: Standing   Other Standing Exercises reviewed doorway pec stretch x 30 sec                    PT Short Term Goals - 06/13/20 1522      PT SHORT TERM GOAL #1   Title Pt will be I with initial HEP    Time 2    Period Weeks    Status On-going    Target Date 06/22/20      PT SHORT TERM GOAL #2   Title Pt will be able to sit x 30 min with 50% less difficulty  to use the computer or eat dinner    Time 2    Period Weeks    Status On-going    Target Date 06/22/20             PT Long Term Goals - 06/08/20 1005      PT LONG TERM GOAL #1   Title pt will report </= 3/10 LBP when resting at night    Baseline 7/10    Time 6    Period Weeks    Status New    Target Date 07/20/20      PT LONG TERM GOAL #2   Title foto score will be improve to 48% limitation    Baseline 61% limitation    Time 6    Period Weeks    Status New    Target Date 07/20/20      PT LONG TERM GOAL #3   Title Pt will be able to sit x 1 hour with < 3/10 LBP to use her phone or computer    Baseline up to 7/10    Time 6    Period Weeks    Status New    Target Date 07/20/20      PT LONG TERM GOAL #4   Title Pt will be able to stand x 30 min with <3/10 LBP to perform ADLS    Baseline 15 min    Time 6    Period Weeks    Status New    Target Date 07/20/20                 Plan - 06/13/20 1518    Clinical Impression Statement Pt reports slight soreness from physical activity at the [facility]. Pt reports changes in  sleeping position with decreasing pillow height under her head and pillow under her knees has improved her discomfort. Pt would benefit from further postural education, chin tuck exercises, scap strengthening, and core strengthening to assist with improvements in posture and reduction in pain.    Rehab Potential Excellent    PT Duration 6 weeks    PT Treatment/Interventions ADLs/Self Care Home Management;Moist Heat;Electrical Stimulation;Functional mobility training;Therapeutic exercise;Therapeutic activities;Patient/family education;Manual techniques;Taping    PT Next Visit Plan continue to progress core and scapular strengthening, chin tuck exercises; review HEP again as pt needed lots of cueing and issue scap retract chest pull (pt has been given a yellow band)    Consulted and Agree with Plan of Care Patient           Patient will benefit from skilled therapeutic intervention in order to improve the following deficits and impairments:  Decreased mobility, Improper body mechanics, Pain, Postural dysfunction, Impaired flexibility, Decreased strength  Visit Diagnosis: Chronic midline low back pain without sciatica  Abnormal posture     Problem List Patient Active Problem List   Diagnosis Date Noted  . Sleep apnea 07/22/2012    Myra Rude, PT 06/13/2020, 3:35 PM  [facility] [address] [place], [name], [address] Phone: [phone]   Fax:  [phone]  Name: Ashlee Wong MRN: [id number] Date of Birth: [birth year]

## 2020-06-19 ENCOUNTER — Other Ambulatory Visit: Payer: Self-pay

## 2020-06-19 ENCOUNTER — Ambulatory Visit: Payer: Medicare Other | Admitting: Physical Therapy

## 2020-06-19 DIAGNOSIS — G8929 Other chronic pain: Secondary | ICD-10-CM

## 2020-06-19 DIAGNOSIS — R293 Abnormal posture: Secondary | ICD-10-CM

## 2020-06-19 DIAGNOSIS — M545 Low back pain: Secondary | ICD-10-CM | POA: Diagnosis not present

## 2020-06-19 NOTE — Therapy (Signed)
[place], [name], [address] Phone: [phone]   Fax:  [phone]  Physical Therapy Treatment  Patient Details  Name: Ashlee Wong MRN: [id number] Date of Birth: 1946-05-11 Referring Provider (PT): Dr. Lucianne Lei   Encounter Date: 06/19/2020   PT End of Session - 06/19/20 1221    Visit Number 3    Number of Visits 12    Date for PT Re-Evaluation 07/20/20    Authorization Type BCBS SHP; BCBS MCR    Progress Note Due on Visit 10    PT Start Time 1130    PT Stop Time 1215    PT Time Calculation (min) 45 min    Activity Tolerance Patient tolerated treatment well;No increased pain    Behavior During Therapy WFL for tasks assessed/performed           Past Medical History:  Diagnosis Date  . Glaucoma   . Hypertension   . Type 2 diabetes mellitus ([place])     Past Surgical History:  Procedure Laterality Date  . ABDOMINAL HYSTERECTOMY    . CATARACT EXTRACTION, BILATERAL      There were no vitals filed for this visit.   Subjective Assessment - 06/19/20 1134    Subjective Pt reports she's been staying active and doing her exercises. Pt states she's been trying to be mindful with her posture.    How long can you sit comfortably? 15 min    How long can you stand comfortably? 15 min    How long can you walk comfortably? does not bother    Currently in Pain? No/denies                             [facility] Adult PT Treatment/Exercise - 06/19/20 0001      Neck Exercises: Seated   Neck Retraction 10 reps    Other Seated Exercise shoulder retraction x 10, shoulder + neck retraction x20      Neck Exercises: Supine   Neck Retraction 10 reps      Lumbar Exercises: Standing   Other Standing Lumbar Exercises PPT against wall x 10   Pt with difficulty attaining correct posture     Lumbar Exercises: Supine   Pelvic Tilt 20 reps    Clam 10 reps   red tband   Clam Limitations red  tband    Bridge 20 reps      Lumbar Exercises: Sidelying   Clam Right;20 reps    Clam Limitations red tband      Shoulder Exercises: Standing   Extension Strengthening;Both;20 reps;Theraband    Theraband Level (Shoulder Extension) Level 3 (Green)    Row Strengthening;Both;20 reps;Theraband    Theraband Level (Shoulder Row) Level 3 Nyoka Cowden)                  PT Education - 06/19/20 1221    Education Details Reinforced head/neck posture. Discussed sitting posture with bolster/towel to assist her.    Person(s) Educated Patient    Methods Explanation;Demonstration;Handout;Verbal cues;Tactile cues    Comprehension Verbalized understanding;Returned demonstration;Verbal cues required;Tactile cues required            PT Short Term Goals - 06/13/20 1522      PT SHORT TERM GOAL #1   Title Pt will be I with initial HEP    Time 2    Period Weeks    Status On-going    Target Date  06/22/20      PT SHORT TERM GOAL #2   Title Pt will be able to sit x 30 min with 50% less difficulty to use the computer or eat dinner    Time 2    Period Weeks    Status On-going    Target Date 06/22/20             PT Long Term Goals - 06/08/20 1005      PT LONG TERM GOAL #1   Title pt will report </= 3/10 LBP when resting at night    Baseline 7/10    Time 6    Period Weeks    Status New    Target Date 07/20/20      PT LONG TERM GOAL #2   Title foto score will be improve to 48% limitation    Baseline 61% limitation    Time 6    Period Weeks    Status New    Target Date 07/20/20      PT LONG TERM GOAL #3   Title Pt will be able to sit x 1 hour with < 3/10 LBP to use her phone or computer    Baseline up to 7/10    Time 6    Period Weeks    Status New    Target Date 07/20/20      PT LONG TERM GOAL #4   Title Pt will be able to stand x 30 min with <3/10 LBP to perform ADLS    Baseline 15 min    Time 6    Period Weeks    Status New    Target Date 07/20/20                  Plan - 06/19/20 1222    Clinical Impression Statement Pt states she is feeling a little better. Treatment focused on progressing pt's core stabilization exercises and ensuring good form with posterior pelvic tilt. Progressed pt's postural exercises in seated and standing position. Pt requires constant cues to ensure good form with chin tuck. Progressed pt to scapular strengthening with tband. Pt able to perform PPT in supine but has difficulty with performing in standing.    Examination-Activity Limitations Lift;Stand;Carry;Sleep    Examination-Participation Restrictions Community Activity;Other    Stability/Clinical Decision Making Stable/Uncomplicated    Rehab Potential Excellent    PT Duration 6 weeks    PT Treatment/Interventions ADLs/Self Care Home Management;Moist Heat;Electrical Stimulation;Functional mobility training;Therapeutic exercise;Therapeutic activities;Patient/family education;Manual techniques;Taping    PT Next Visit Plan continue to progress core and scapular strengthening, chin tuck exercises; review HEP again as pt needed lots of cueing and issue scap retract chest pull.    PT Home Exercise Plan Access Code [email or web address]    [email or web address] and Agree with Plan of Care Patient           Patient will benefit from skilled therapeutic intervention in order to improve the following deficits and impairments:  Decreased mobility, Improper body mechanics, Pain, Postural dysfunction, Impaired flexibility, Decreased strength  Visit Diagnosis: Chronic midline low back pain without sciatica  Abnormal posture     Problem List Patient Active Problem List   Diagnosis Date Noted  . Sleep apnea 07/22/2012    [facility] [address] PT, DPT 06/19/2020, 12:25 PM  [facility] [address] [place], [name], [address] Phone: [phone]   Fax:  [phone]  Name: Ashlee Wong MRN: [id number] Date of Birth:  [birth year]

## 2020-06-22 ENCOUNTER — Other Ambulatory Visit: Payer: Self-pay

## 2020-06-22 ENCOUNTER — Ambulatory Visit: Payer: Medicare Other | Admitting: Physical Therapy

## 2020-06-22 DIAGNOSIS — G8929 Other chronic pain: Secondary | ICD-10-CM

## 2020-06-22 DIAGNOSIS — R293 Abnormal posture: Secondary | ICD-10-CM

## 2020-06-22 DIAGNOSIS — M545 Low back pain, unspecified: Secondary | ICD-10-CM

## 2020-06-22 NOTE — Therapy (Signed)
[place], [name], [address] Phone: [phone]   Fax:  [phone]  Physical Therapy Treatment  Patient Details  Name: Ashlee Wong MRN: [id number] Date of Birth: [birth year] Referring Provider (PT): Dr. Lucianne Lei   Encounter Date: 06/22/2020   PT End of Session - 06/22/20 0919    Visit Number 4    Number of Visits 12    Date for PT Re-Evaluation 07/20/20    Authorization Type BCBS SHP; BCBS MCR    Progress Note Due on Visit 10    PT Start Time 0922    PT Stop Time 1005    PT Time Calculation (min) 43 min    Activity Tolerance Patient tolerated treatment well;No increased pain    Behavior During Therapy WFL for tasks assessed/performed           Past Medical History:  Diagnosis Date  . Glaucoma   . Hypertension   . Type 2 diabetes mellitus ([place])     Past Surgical History:  Procedure Laterality Date  . ABDOMINAL HYSTERECTOMY    . CATARACT EXTRACTION, BILATERAL      There were no vitals filed for this visit.   Subjective Assessment - 06/22/20 1002    Subjective Pt reports the exercises have been going well. Pt states she has no pain today.    How long can you sit comfortably? 15 min    How long can you stand comfortably? 15 min    How long can you walk comfortably? does not bother                             [facility] Adult PT Treatment/Exercise - 06/22/20 0001      Lumbar Exercises: Standing   Other Standing Lumbar Exercises PPT against wall x 5   pt with difficulty performing     Lumbar Exercises: Seated   Other Seated Lumbar Exercises On dynadisk: PPT x20, side to side x 20, circles CW & CCW x 10      Lumbar Exercises: Supine   Pelvic Tilt 20 reps    Bridge 20 reps    Other Supine Lumbar Exercises tilt with knees bent & straighten on pball 2x 10,       Lumbar Exercises: Sidelying   Hip Abduction Both;20 reps    Other Sidelying Lumbar Exercises Open/close  book 2x10      Shoulder Exercises: Standing   Other Standing Exercises Wall angel x 10      Manual Therapy   Manual therapy comments Self thoracic mobs with rolled towel x 3 min                    PT Short Term Goals - 06/13/20 1522      PT SHORT TERM GOAL #1   Title Pt will be I with initial HEP    Time 2    Period Weeks    Status On-going    Target Date 06/22/20      PT SHORT TERM GOAL #2   Title Pt will be able to sit x 30 min with 50% less difficulty to use the computer or eat dinner    Time 2    Period Weeks    Status On-going    Target Date 06/22/20             PT Long Term Goals - 06/08/20 1005  PT LONG TERM GOAL #1   Title pt will report </= 3/10 LBP when resting at night    Baseline 7/10    Time 6    Period Weeks    Status New    Target Date 07/20/20      PT LONG TERM GOAL #2   Title foto score will be improve to 48% limitation    Baseline 61% limitation    Time 6    Period Weeks    Status New    Target Date 07/20/20      PT LONG TERM GOAL #3   Title Pt will be able to sit x 1 hour with < 3/10 LBP to use her phone or computer    Baseline up to 7/10    Time 6    Period Weeks    Status New    Target Date 07/20/20      PT LONG TERM GOAL #4   Title Pt will be able to stand x 30 min with <3/10 LBP to perform ADLS    Baseline 15 min    Time 6    Period Weeks    Status New    Target Date 07/20/20                 Plan - 06/22/20 0939    Clinical Impression Statement Treatment focused on improving thoracic spine mobility, pelvic mobility, and continuing to progress core stabilization exercises. Pt requires cueing for proper form.    Examination-Activity Limitations Lift;Stand;Carry;Sleep    Examination-Participation Restrictions Community Activity;Other    Stability/Clinical Decision Making Stable/Uncomplicated    Rehab Potential Excellent    PT Duration 6 weeks    PT Treatment/Interventions ADLs/Self Care Home  Management;Moist Heat;Electrical Stimulation;Functional mobility training;Therapeutic exercise;Therapeutic activities;Patient/family education;Manual techniques;Taping    PT Next Visit Plan continue to progress core and scapular strengthening, chin tuck exercises, pelvic mobility in standing; review HEP again as pt needed lots of cueing and issue scap retract chest pull.    PT Home Exercise Plan Access Code [email or web address]    [email or web address] and Agree with Plan of Care Patient           Patient will benefit from skilled therapeutic intervention in order to improve the following deficits and impairments:  Decreased mobility, Improper body mechanics, Pain, Postural dysfunction, Impaired flexibility, Decreased strength  Visit Diagnosis: Chronic midline low back pain without sciatica  Abnormal posture     Problem List Patient Active Problem List   Diagnosis Date Noted  . Sleep apnea 07/22/2012    [facility] [address] [place] PT, DPT 06/22/2020, 10:06 AM  [facility] [address] [place], [name], [address] Phone: [phone]   Fax:  [phone]  Name: Ashlee Wong MRN: [id number] Date of Birth: Sep 22, 1946

## 2020-06-26 ENCOUNTER — Ambulatory Visit: Payer: Medicare Other | Admitting: Physical Therapy

## 2020-06-26 ENCOUNTER — Other Ambulatory Visit: Payer: Self-pay

## 2020-06-26 DIAGNOSIS — R293 Abnormal posture: Secondary | ICD-10-CM

## 2020-06-26 DIAGNOSIS — G8929 Other chronic pain: Secondary | ICD-10-CM

## 2020-06-26 DIAGNOSIS — M545 Low back pain, unspecified: Secondary | ICD-10-CM

## 2020-06-26 NOTE — Therapy (Signed)
[place], [name], [address] Phone: [phone]   Fax:  [phone]  Physical Therapy Treatment  Patient Details  Name: Ashlee Wong MRN: [id number] Date of Birth: 1946-02-22 Referring Provider (PT): Dr. Lucianne Lei   Encounter Date: 06/26/2020   PT End of Session - 06/26/20 1009    Visit Number 5    Number of Visits 12    Date for PT Re-Evaluation 07/20/20    Authorization Type BCBS SHP; BCBS MCR    Progress Note Due on Visit 10    PT Start Time 1001    PT Stop Time 1045    PT Time Calculation (min) 44 min    Activity Tolerance Patient tolerated treatment well;No increased pain    Behavior During Therapy WFL for tasks assessed/performed           Past Medical History:  Diagnosis Date  . Glaucoma   . Hypertension   . Type 2 diabetes mellitus ([place])     Past Surgical History:  Procedure Laterality Date  . ABDOMINAL HYSTERECTOMY    . CATARACT EXTRACTION, BILATERAL      There were no vitals filed for this visit.   Subjective Assessment - 06/26/20 1006    Subjective Pt states that she really feels her back pain more in the evening. She is otherwise doing well.    Pertinent History LBP history x 2 years    Limitations Sitting;House hold activities;Standing;Walking    How long can you sit comfortably? 15 min    How long can you stand comfortably? 15 min    How long can you walk comfortably? does not bother    Currently in Pain? No/denies                             [facility] Adult PT Treatment/Exercise - 06/26/20 0001      Lumbar Exercises: Stretches   Active Hamstring Stretch Right;Left;30 seconds      Lumbar Exercises: Aerobic   Nustep L4 x 5 min UE & LE      Lumbar Exercises: Seated   Other Seated Lumbar Exercises On dynadisk: PPT x20, side to side x 20, circles CW & CCW x 10      Lumbar Exercises: Supine   Bridge 20 reps    Other Supine Lumbar Exercises bilat  knees to chest with alternating march down 2x10      Lumbar Exercises: Sidelying   Hip Abduction Both;20 reps      Shoulder Exercises: Standing   Extension Strengthening;Both;20 reps;Theraband    Theraband Level (Shoulder Extension) Level 4 (Blue)    Row Strengthening;Both;20 reps;Theraband    Theraband Level (Shoulder Row) Level 4 (Blue)    Other Standing Exercises Palloff press blue tband 2x 10 bilat                    PT Short Term Goals - 06/13/20 1522      PT SHORT TERM GOAL #1   Title Pt will be I with initial HEP    Time 2    Period Weeks    Status On-going    Target Date 06/22/20      PT SHORT TERM GOAL #2   Title Pt will be able to sit x 30 min with 50% less difficulty to use the computer or eat dinner    Time 2    Period Weeks  Status On-going    Target Date 06/22/20             PT Long Term Goals - 06/08/20 1005      PT LONG TERM GOAL #1   Title pt will report </= 3/10 LBP when resting at night    Baseline 7/10    Time 6    Period Weeks    Status New    Target Date 07/20/20      PT LONG TERM GOAL #2   Title foto score will be improve to 48% limitation    Baseline 61% limitation    Time 6    Period Weeks    Status New    Target Date 07/20/20      PT LONG TERM GOAL #3   Title Pt will be able to sit x 1 hour with < 3/10 LBP to use her phone or computer    Baseline up to 7/10    Time 6    Period Weeks    Status New    Target Date 07/20/20      PT LONG TERM GOAL #4   Title Pt will be able to stand x 30 min with <3/10 LBP to perform ADLS    Baseline 15 min    Time 6    Period Weeks    Status New    Target Date 07/20/20                 Plan - 06/26/20 1020    Clinical Impression Statement Treatment focused on core stability in standing. Progressed pt to blue tband exercises. Pt is progressing well. Appears that pt's biggest issue is muscle endurance as she gets increased pain later in the day.    Examination-Activity  Limitations Lift;Stand;Carry;Sleep    Examination-Participation Restrictions Community Activity;Other    Stability/Clinical Decision Making Stable/Uncomplicated    Rehab Potential Excellent    PT Duration 6 weeks    PT Treatment/Interventions ADLs/Self Care Home Management;Moist Heat;Electrical Stimulation;Functional mobility training;Therapeutic exercise;Therapeutic activities;Patient/family education;Manual techniques;Taping    PT Next Visit Plan continue to progress core and scapular strengthening, chin tuck exercises, pelvic mobility in standing; review HEP again as pt needed lots of cueing and issue scap retract chest pull.    PT Home Exercise Plan Access Code [email or web address]    GYIRSWNIO and Agree with Plan of Care Patient           Patient will benefit from skilled therapeutic intervention in order to improve the following deficits and impairments:  Decreased mobility, Improper body mechanics, Pain, Postural dysfunction, Impaired flexibility, Decreased strength  Visit Diagnosis: No diagnosis found.     Problem List Patient Active Problem List   Diagnosis Date Noted  . Sleep apnea 07/22/2012    [facility] [address] [place] PT, DPT 06/26/2020, 10:42 AM  [facility] [address] [place], [name], [address] Phone: [phone]   Fax:  [phone]  Name: Ashlee Wong MRN: [id number] Date of Birth: Feb 08, 1946

## 2020-06-29 ENCOUNTER — Ambulatory Visit: Payer: Medicare Other | Admitting: Physical Therapy

## 2020-06-29 ENCOUNTER — Other Ambulatory Visit: Payer: Self-pay

## 2020-06-29 DIAGNOSIS — G8929 Other chronic pain: Secondary | ICD-10-CM

## 2020-06-29 DIAGNOSIS — M545 Low back pain: Secondary | ICD-10-CM | POA: Diagnosis not present

## 2020-06-29 DIAGNOSIS — R293 Abnormal posture: Secondary | ICD-10-CM

## 2020-06-29 NOTE — Therapy (Signed)
Ashlee Wong, [name], [address] Phone: [phone]   Fax:  [phone]  Physical Therapy Treatment  Patient Details  Name: Ashlee Wong MRN: [id number] Date of Birth: Mar 29, 1946 Referring Provider (PT): Dr. Lucianne Lei   Encounter Date: 06/29/2020   PT End of Session - 06/29/20 0956    Visit Number 6    Number of Visits 12    Date for PT Re-Evaluation 07/20/20    Authorization Type BCBS SHP; BCBS MCR    Progress Note Due on Visit 10    PT Start Time [id number]    PT Stop Time 1048    PT Time Calculation (min) 50 min    Activity Tolerance Patient tolerated treatment well;No increased pain    Behavior During Therapy WFL for tasks assessed/performed           Past Medical History:  Diagnosis Date  . Glaucoma   . Hypertension   . Type 2 diabetes mellitus (Ashlee Wong)     Past Surgical History:  Procedure Laterality Date  . ABDOMINAL HYSTERECTOMY    . CATARACT EXTRACTION, BILATERAL      There were no vitals filed for this visit.   Subjective Assessment - 06/29/20 1001    Subjective Pt states she's been doing the exercises. She reports she's been trying to catch herself when performing bad posture. Pt reports 10/10 pain at its worst in the evenings. Pt states that since starting therapy she feels she's not getting the pain as often.    Pertinent History LBP history x 2 years    Limitations Sitting;House hold activities;Standing;Walking    How long can you sit comfortably? 15 min    How long can you stand comfortably? 15 min    How long can you walk comfortably? does not bother    Patient Stated Goals sit up straight    Currently in Pain? No/denies                             [facility] Adult PT Treatment/Exercise - 06/29/20 0001      Lumbar Exercises: Aerobic   Nustep L5 x 5 min UE & LE      Lumbar Exercises: Standing   Wall Slides 2 seconds;20 reps    Other Standing Lumbar Exercises  Hip abduction 3x10 bilat with green tband; PPT with PEC 2x10    Other Standing Lumbar Exercises Hip extension 3x10 bilat with green tband      Lumbar Exercises: Supine   Ab Set 20 reps    Pelvic Tilt 10 reps    Other Supine Lumbar Exercises bilat knees to chest with alternating march down 2x10    Other Supine Lumbar Exercises pball roll out under ankle 2x10      Shoulder Exercises: Supine   Other Supine Exercises thoracic self mobilization with rolled towel x 5 with 10 sec hold      Shoulder Exercises: Standing   Row Strengthening;Both;20 reps;Theraband    Theraband Level (Shoulder Row) Level 4 (Blue)    Other Standing Exercises modified deadlift position against wall x 10    Other Standing Exercises low trap setting 2x10                  PT Education - 06/29/20 1049    Education Details FOTO performed and educated pt on results. Discussed phone reminders to cue to her to check on her posture.  Person(s) Educated Patient    Methods Explanation;Demonstration;Tactile cues;Handout;Verbal cues    Comprehension Verbalized understanding;Returned demonstration;Verbal cues required;Tactile cues required            PT Short Term Goals - 06/13/20 1522      PT SHORT TERM GOAL #1   Title Pt will be I with initial HEP    Time 2    Period Weeks    Status On-going    Target Date 06/22/20      PT SHORT TERM GOAL #2   Title Pt will be able to sit x 30 min with 50% less difficulty to use the computer or eat dinner    Time 2    Period Weeks    Status On-going    Target Date 06/22/20             PT Long Term Goals - 06/08/20 1005      PT LONG TERM GOAL #1   Title pt will report </= 3/10 LBP when resting at night    Baseline 7/10    Time 6    Period Weeks    Status New    Target Date 07/20/20      PT LONG TERM GOAL #2   Title foto score will be improve to 48% limitation    Baseline 61% limitation    Time 6    Period Weeks    Status New    Target Date 07/20/20       PT LONG TERM GOAL #3   Title Pt will be able to sit x 1 hour with < 3/10 LBP to use her phone or computer    Baseline up to 7/10    Time 6    Period Weeks    Status New    Target Date 07/20/20      PT LONG TERM GOAL #4   Title Pt will be able to stand x 30 min with <3/10 LBP to perform ADLS    Baseline 15 min    Time 6    Period Weeks    Status New    Target Date 07/20/20                 Plan - 06/29/20 1014    Clinical Impression Statement Pt is performing HEP at home. Pt states she was not sore after previous session. PT continues to progress pt's bilat LE and core strength and endurance. FOTO taken today and FOTO score has improved to 53% limitation. Increased time spent in attempting to decrease lumbar lordosis in standing with standing PPT.    Examination-Activity Limitations Lift;Stand;Carry;Sleep    Examination-Participation Restrictions Community Activity;Other    Stability/Clinical Decision Making Stable/Uncomplicated    Rehab Potential Excellent    PT Duration 6 weeks    PT Treatment/Interventions ADLs/Self Care Home Management;Moist Heat;Electrical Stimulation;Functional mobility training;Therapeutic exercise;Therapeutic activities;Patient/family education;Manual techniques;Taping    PT Next Visit Plan continue to progress core and scapular strengthening, chin tuck exercises, pelvic mobility in standing; review HEP again as pt needed lots of cueing. Continue to progress pt to lifting exercises (i.e. deadlift and/or squats). Consider leg press machine    PT Home Exercise Plan Access Code [email or web address]    Consulted and Agree with Plan of Care Patient           Patient will benefit from skilled therapeutic intervention in order to improve the following deficits and impairments:  Decreased mobility, Improper body mechanics, Pain, Postural dysfunction, Impaired flexibility, Decreased strength  Visit Diagnosis: Chronic midline low back pain without  sciatica  Abnormal posture     Problem List Patient Active Problem List   Diagnosis Date Noted  . Sleep apnea 07/22/2012    Ashlee Wong Ashlee Wong PT, DPT 06/29/2020, 11:00 AM  [facility] [address] [place], [name], [address] Phone: [phone]   Fax:  [phone]  Name: Ashlee Wong MRN: [id number] Date of Birth: [birth year]

## 2020-07-03 ENCOUNTER — Ambulatory Visit: Payer: Medicare Other | Admitting: Physical Therapy

## 2020-07-03 ENCOUNTER — Other Ambulatory Visit: Payer: Self-pay

## 2020-07-03 DIAGNOSIS — G8929 Other chronic pain: Secondary | ICD-10-CM

## 2020-07-03 DIAGNOSIS — M545 Low back pain: Secondary | ICD-10-CM | POA: Diagnosis not present

## 2020-07-03 DIAGNOSIS — R293 Abnormal posture: Secondary | ICD-10-CM

## 2020-07-03 NOTE — Therapy (Signed)
[place], [name], [address] Phone: [phone]   Fax:  [phone]  Physical Therapy Treatment  Patient Details  Name: Ashlee Wong MRN: [id number] Date of Birth: 12/29/1945 Referring Provider (PT): Dr. Lucianne Lei   Encounter Date: 07/03/2020   PT End of Session - 07/03/20 0953    Visit Number 7    Number of Visits 12    Date for PT Re-Evaluation 07/20/20    Authorization Type BCBS SHP; BCBS MCR    Progress Note Due on Visit 10    PT Start Time 0955    PT Stop Time 1043    PT Time Calculation (min) 48 min    Activity Tolerance Patient tolerated treatment well;No increased pain    Behavior During Therapy WFL for tasks assessed/performed           Past Medical History:  Diagnosis Date  . Glaucoma   . Hypertension   . Type 2 diabetes mellitus ([place])     Past Surgical History:  Procedure Laterality Date  . ABDOMINAL HYSTERECTOMY    . CATARACT EXTRACTION, BILATERAL      There were no vitals filed for this visit.   Subjective Assessment - 07/03/20 0959    Subjective Pt reports some right side low back pain. Pt states she was sick yesterday and thinks it may have resulted in this pain today. Pt has continued to perform the exercises at home and reports no issues.    Pertinent History LBP history x 2 years    Limitations Sitting;House hold activities;Standing;Walking    How long can you sit comfortably? 15 min    How long can you stand comfortably? 15 min    How long can you walk comfortably? does not bother    Patient Stated Goals sit up straight    Currently in Pain? Yes    Pain Score 7     Pain Location Back    Pain Orientation Right    Pain Descriptors / Indicators Aching    Pain Type Acute pain    Pain Radiating Towards R hip                             [facility] Adult PT Treatment/Exercise - 07/03/20 0001      Lumbar Exercises: Stretches   Single Knee to Chest  Stretch Right;Left;1 rep;10 seconds    Lower Trunk Rotation 1 rep;10 seconds    Other Lumbar Stretch Exercise pball flexion stretch x 30 sec R & L    Other Lumbar Stretch Exercise seated side flexion 2x20 sec R & L      Lumbar Exercises: Aerobic   Nustep L5 x 5 min UE & LE      Lumbar Exercises: Machines for Strengthening   Leg Press DL: 35# 2x10      Lumbar Exercises: Standing   Shoulder Extension Strengthening;Both;20 reps;Theraband    Theraband Level (Shoulder Extension) Level 4 (Blue)    Other Standing Lumbar Exercises modified deadlift against wall with 5# 2x10      Lumbar Exercises: Supine   Bridge --   3x10    Other Supine Lumbar Exercises bilat knees to chest with alternating march down 2x10      Shoulder Exercises: Standing   External Rotation Strengthening;Both;20 reps;Theraband    Theraband Level (Shoulder External Rotation) Level 3 (Green)    Row Strengthening;Both;20 reps;Theraband    Theraband Level (Shoulder Row)  Level 4 (Blue)    Other Standing Exercises modified deadlift position against wall x 10    Other Standing Exercises low trap setting 2x10                    PT Short Term Goals - 07/03/20 1002      PT SHORT TERM GOAL #1   Title Pt will be I with initial HEP    Time 2    Period Weeks    Status Achieved    Target Date 06/22/20      PT SHORT TERM GOAL #2   Title Pt will be able to sit x 30 min with 50% less difficulty to use the computer or eat dinner    Time 2    Period Weeks    Status Achieved    Target Date 06/22/20             PT Long Term Goals - 06/08/20 1005      PT LONG TERM GOAL #1   Title pt will report </= 3/10 LBP when resting at night    Baseline 7/10    Time 6    Period Weeks    Status New    Target Date 07/20/20      PT LONG TERM GOAL #2   Title foto score will be improve to 48% limitation    Baseline 61% limitation    Time 6    Period Weeks    Status New    Target Date 07/20/20      PT LONG TERM GOAL  #3   Title Pt will be able to sit x 1 hour with < 3/10 LBP to use her phone or computer    Baseline up to 7/10    Time 6    Period Weeks    Status New    Target Date 07/20/20      PT LONG TERM GOAL #4   Title Pt will be able to stand x 30 min with <3/10 LBP to perform ADLS    Baseline 15 min    Time 6    Period Weeks    Status New    Target Date 07/20/20                 Plan - 07/03/20 1029    Clinical Impression Statement Pt with some R side pain/stiffness this session. PT addressed with manual therapy and gentle stretching. Rest of treatment focused on continued core strengthening, scapular strengthening, and bilateral LE strengthening for posture and endurance. Pt continues to progress well and meeting goals.    Examination-Activity Limitations Lift;Stand;Carry;Sleep    Examination-Participation Restrictions Community Activity;Other    Stability/Clinical Decision Making Stable/Uncomplicated    Rehab Potential Excellent    PT Duration 6 weeks    PT Treatment/Interventions ADLs/Self Care Home Management;Moist Heat;Electrical Stimulation;Functional mobility training;Therapeutic exercise;Therapeutic activities;Patient/family education;Manual techniques;Taping    PT Next Visit Plan continue to progress core and scapular strengthening, chin tuck exercises, pelvic mobility; review HEP again as pt needed lots of cueing. Continue to progress pt deadlift.    PT Home Exercise Plan Access Code [email or web address]    RJJOACZYS and Agree with Plan of Care Patient           Patient will benefit from skilled therapeutic intervention in order to improve the following deficits and impairments:  Decreased mobility, Improper body mechanics, Pain, Postural dysfunction, Impaired flexibility, Decreased strength  Visit Diagnosis: Chronic midline low back pain without sciatica  Abnormal posture     Problem List Patient Active Problem List   Diagnosis Date Noted  . Sleep apnea 07/22/2012     [facility] [address] PT, DPT 07/03/2020, 10:51 AM  [facility] [address] [place], [name], [address] Phone: [phone]   Fax:  [phone]  Name: Ashlee Wong MRN: [id number] Date of Birth: [birth year]

## 2020-07-06 ENCOUNTER — Other Ambulatory Visit: Payer: Self-pay

## 2020-07-06 ENCOUNTER — Ambulatory Visit: Payer: Medicare Other | Admitting: Physical Therapy

## 2020-07-06 ENCOUNTER — Encounter: Payer: Self-pay | Admitting: Physical Therapy

## 2020-07-06 DIAGNOSIS — M545 Low back pain: Secondary | ICD-10-CM | POA: Diagnosis not present

## 2020-07-06 DIAGNOSIS — G8929 Other chronic pain: Secondary | ICD-10-CM

## 2020-07-06 DIAGNOSIS — R293 Abnormal posture: Secondary | ICD-10-CM

## 2020-07-06 NOTE — Therapy (Signed)
[facility] [address] [place], [place], [address] Phone: [phone]   Fax:  [phone]  Physical Therapy Treatment Progress Note Reporting Period 06/08/2020 to 07/06/2020  See note below for Objective Data and Assessment of Progress/Goals.       Patient Details  Name: Ashlee Wong MRN: [id number] Date of Birth: 12/30/1945 Referring Provider (PT): Dr. Veita Bland   Encounter Date: 07/06/2020   PT End of Session - 07/06/20 0929    Visit Number 8    Number of Visits 12    Date for PT Re-Evaluation 07/20/20    Authorization Type BCBS SHP; BCBS MCR, next progess note by visit 18    PT Start Time 0926    PT Stop Time 1009    PT Time Calculation (min) 43 min    Activity Tolerance Patient tolerated treatment well    Behavior During Therapy WFL for tasks assessed/performed           Past Medical History:  Diagnosis Date  . Glaucoma   . Hypertension   . Type 2 diabetes mellitus (HCC)     Past Surgical History:  Procedure Laterality Date  . ABDOMINAL HYSTERECTOMY    . CATARACT EXTRACTION, BILATERAL      There were no vitals filed for this visit.   Subjective Assessment - 07/06/20 0930    Subjective Pt. presents for 8th therapy visit today-she rates improvement for her back at 45% from baseline status. She reports becoming more aware of posture with day to day activities and that the pain is not as intense. Less pain/not as severe at night and overall pain is "not as often"/less constant. Also with continued discomfort in upper thoracic region.    Pertinent History LBP history x 2 years    Limitations Sitting;House hold activities;Standing;Walking    How long can you sit comfortably? 20-25 minutes    How long can you stand comfortably? 25 minutes    How long can you walk comfortably? does not bother    Patient Stated Goals sit up straight    Currently in Pain? Yes    Pain Score 5     Pain Location Back     Pain Orientation Right    Pain Descriptors / Indicators Aching    Pain Type Chronic pain    Pain Radiating Towards right hip    Pain Onset More than a month ago    Pain Frequency Intermittent    Aggravating Factors  sitting, driving, working on computer/paperwork    Pain Relieving Factors Tylenol    Effect of Pain on Daily Activities limits positional tolerance              [facility] PT Assessment - 07/06/20 0001      Observation/Other Assessments   Focus on Therapeutic Outcomes (FOTO)  --   53% limited assessed 06/29/20     Strength   Right Hip Flexion 4/5    Right Hip Extension 4-/5    Right Hip ABduction 4+/5    Right Hip ADduction 4+/5    Left Hip Flexion 4/5    Left Hip Extension 4/5    Left Hip ABduction 4+/5    Left Hip ADduction 4+/5    Right Knee Flexion 5/5    Right Knee Extension 5/5    Left Knee Flexion 4+/5    Left Knee Extension 5/5                           [place] Adult PT Treatment/Exercise - 07/06/20 0001      Lumbar Exercises: Stretches   Double Knee to Chest Stretch Limitations 2x10 brief holds with legs on 55 cm P-ball    Lower Trunk Rotation Limitations brief holds x 10 reps bilat. with legs on 55 cm P-ball    Pelvic Tilt 15 reps    Pelvic Tilt Limitations 5 second holds      Lumbar Exercises: Aerobic   Nustep L5 x 6 min UE/LE      Lumbar Exercises: Machines for Strengthening   Leg Press 35 lbs. 2x10   Omega leg press-bilat. LE     Lumbar Exercises: Standing   Row AROM;Strengthening;Both;20 reps    Theraband Level (Row) Level 4 (Blue)    Shoulder Extension Strengthening;Both;20 reps;Theraband    Theraband Level (Shoulder Extension) Level 4 (Blue)    Other Standing Lumbar Exercises KB deadlift with back at wall 2x10 with 10 lbs.      Lumbar Exercises: Supine   Clam 20 reps    Clam Limitations red Theraband    Bent Knee Raise 20 reps    Bridge 20 reps      Shoulder Exercises: Supine   Horizontal ABduction  AROM;Strengthening;Both;20 reps    Theraband Level (Shoulder Horizontal ABduction) Level 2 (Red)    External Rotation AROM;Strengthening;Both;20 reps    Theraband Level (Shoulder External Rotation) Level 2 (Red)                  PT Education - 07/06/20 1012    Education Details POC    Person(s) Educated Patient    Methods Explanation    Comprehension Verbalized understanding            PT Short Term Goals - 07/03/20 1002      PT SHORT TERM GOAL #1   Title Pt will be I with initial HEP    Time 2    Period Weeks    Status Achieved    Target Date 06/22/20      PT SHORT TERM GOAL #2   Title Pt will be able to sit x 30 min with 50% less difficulty to use the computer or eat dinner    Time 2    Period Weeks    Status Achieved    Target Date 06/22/20             PT Long Term Goals - 07/06/20 0946      PT LONG TERM GOAL #1   Title pt will report </= 3/10 LBP when resting at night    Baseline still intemittently with higher pain level 7-8/10    Time 6    Period Weeks    Status On-going    Target Date 07/20/20      PT LONG TERM GOAL #2   Title foto score will be improve to 48% limitation    Baseline 53% limited    Time 6    Period Weeks    Status On-going    Target Date 07/20/20      PT LONG TERM GOAL #3   Title Pt will be able to sit x 1 hour with < 3/10 LBP to use her phone or computer    Baseline up to 8/10    Time 6    Period Weeks    Status On-going    Target Date 07/20/20      PT LONG TERM GOAL #4   Title Pt will be able to stand x 30  min with <3/10 LBP to perform ADLS    Baseline 25 min with pain 3-4/10    Time 6    Period Weeks    Status On-going    Target Date 07/20/20                 Plan - 07/06/20 1012    Clinical Impression Statement At visit 8 pt. is making moderate improvement with therapy with decreased pain and functional gains for positional tolerance for sitting and standing as well as less sleep disturbance. Also  showing improvement with LE strength from baseline status as well-still with postural kyphosis but body mechanics improving. STGs met-progress with LTGs still ongoing so plan continue PT for further progress to address remaining functional limitations.    Examination-Activity Limitations Lift;Stand;Carry;Sleep    Examination-Participation Restrictions Community Activity;Other    Stability/Clinical Decision Making Stable/Uncomplicated    Clinical Decision Making Low    Rehab Potential Excellent    PT Frequency 2x / week    PT Duration 6 weeks    PT Treatment/Interventions ADLs/Self Care Home Management;Moist Heat;Electrical Stimulation;Functional mobility training;Therapeutic exercise;Therapeutic activities;Patient/family education;Manual techniques;Taping    PT Next Visit Plan continue to progress core and scapular strengthening, chin tuck exercises, pelvic mobility; review HEP again as pt needed lots of cueing. Continue to progress pt deadlift.    PT Home Exercise Plan Access Code [email or web address]    Consulted and Agree with Plan of Care Patient           Patient will benefit from skilled therapeutic intervention in order to improve the following deficits and impairments:  Decreased mobility, Improper body mechanics, Pain, Postural dysfunction, Impaired flexibility, Decreased strength  Visit Diagnosis: Chronic midline low back pain without sciatica  Abnormal posture     Problem List Patient Active Problem List   Diagnosis Date Noted  . Sleep apnea 07/22/2012    Christopher Zoch, PT, DPT 07/06/20 10:16 AM  [facility] [address] [place], [place], [address] Phone: [phone]   Fax:  [phone]  Name: Ashlee Wong MRN: [id number] Date of Birth: 05/29/1946   

## 2020-07-10 ENCOUNTER — Other Ambulatory Visit (HOSPITAL_BASED_OUTPATIENT_CLINIC_OR_DEPARTMENT_OTHER): Payer: Self-pay

## 2020-07-10 DIAGNOSIS — R0683 Snoring: Secondary | ICD-10-CM

## 2020-07-13 ENCOUNTER — Ambulatory Visit: Payer: Medicare Other | Attending: Family Medicine | Admitting: Physical Therapy

## 2020-07-13 ENCOUNTER — Other Ambulatory Visit: Payer: Self-pay

## 2020-07-13 DIAGNOSIS — R293 Abnormal posture: Secondary | ICD-10-CM | POA: Diagnosis present

## 2020-07-13 DIAGNOSIS — G8929 Other chronic pain: Secondary | ICD-10-CM

## 2020-07-13 DIAGNOSIS — M545 Low back pain: Secondary | ICD-10-CM | POA: Diagnosis present

## 2020-07-13 NOTE — Therapy (Signed)
[place], [name], [address] Phone: [phone]   Fax:  [phone]  Physical Therapy Treatment  Patient Details  Name: Ashlee Wong MRN: [id number] Date of Birth: 08-08-1946 Referring Provider (PT): Dr. Lucianne Lei   Encounter Date: 07/13/2020   PT End of Session - 07/13/20 0934    Visit Number 9    Number of Visits 12    Date for PT Re-Evaluation 07/20/20    Authorization Type BCBS SHP; BCBS MCR, next progess note by visit 18    PT Start Time 0930    PT Stop Time 1015    PT Time Calculation (min) 45 min           Past Medical History:  Diagnosis Date  . Glaucoma   . Hypertension   . Type 2 diabetes mellitus ([place])     Past Surgical History:  Procedure Laterality Date  . ABDOMINAL HYSTERECTOMY    . CATARACT EXTRACTION, BILATERAL      There were no vitals filed for this visit.   Subjective Assessment - 07/13/20 0934    Subjective A little pain in right shoulder blade..No back pain this morning.    Currently in Pain? Yes    Pain Score 2     Pain Location Scapula    Pain Orientation Right    Pain Descriptors / Indicators Aching    Pain Type Chronic pain                             [facility] Adult PT Treatment/Exercise - 07/13/20 0001      Neck Exercises: Seated   Other Seated Exercise seated scap squeeze and chin tucks x 10 each       Lumbar Exercises: Aerobic   Nustep L5 x 6 min UE/LE      Lumbar Exercises: Machines for Strengthening   Leg Press 45 lbs. 2x10   Omega leg press-bilat. LE     Lumbar Exercises: Standing   Row AROM;Strengthening;Both;20 reps    Theraband Level (Row) Level 4 (Blue)    Shoulder Extension Strengthening;Both;20 reps;Theraband    Theraband Level (Shoulder Extension) Level 4 (Blue)    Other Standing Lumbar Exercises Sit-stand x 10 with cues for controlled descent       Lumbar Exercises: Supine   Clam 20 reps    Clam Limitations red  Theraband    Bridge 20 reps      Shoulder Exercises: Supine   Horizontal ABduction AROM;Strengthening;Both;20 reps    Theraband Level (Shoulder Horizontal ABduction) Level 2 (Red)    External Rotation AROM;Strengthening;Both;20 reps    Theraband Level (Shoulder External Rotation) Level 2 (Red)    Other Supine Exercises open books, LTR                    PT Short Term Goals - 07/03/20 1002      PT SHORT TERM GOAL #1   Title Pt will be I with initial HEP    Time 2    Period Weeks    Status Achieved    Target Date 06/22/20      PT SHORT TERM GOAL #2   Title Pt will be able to sit x 30 min with 50% less difficulty to use the computer or eat dinner    Time 2    Period Weeks    Status Achieved    Target Date 06/22/20  PT Long Term Goals - 07/06/20 0946      PT LONG TERM GOAL #1   Title pt will report </= 3/10 LBP when resting at night    Baseline still intemittently with higher pain level 7-8/10    Time 6    Period Weeks    Status On-going    Target Date 07/20/20      PT LONG TERM GOAL #2   Title foto score will be improve to 48% limitation    Baseline 53% limited    Time 6    Period Weeks    Status On-going    Target Date 07/20/20      PT LONG TERM GOAL #3   Title Pt will be able to sit x 1 hour with < 3/10 LBP to use her phone or computer    Baseline up to 8/10    Time 6    Period Weeks    Status On-going    Target Date 07/20/20      PT LONG TERM GOAL #4   Title Pt will be able to stand x 30 min with <3/10 LBP to perform ADLS    Baseline 25 min with pain 3-4/10    Time 6    Period Weeks    Status On-going    Target Date 07/20/20                 Plan - 07/13/20 1000    Clinical Impression Statement Pt arrives without LBP and min right periscap pain. Continued with scapular and lumbar stab, LE strength. Pt tolerated session well. Based on FOTO limitation, asked patient what housework is difficult, she reports trouble washing  dishes and sitting to do bible work. Education provided on how to set up books to provide elevation and alow better posture. Will review body mechanics with washing dishes in next session.    PT Next Visit Plan Work on stairs, washing dishes, seated posture for bible work , work toward completing [place], discharge [place]           Patient will benefit from skilled therapeutic intervention in order to improve the following deficits and impairments:  Decreased mobility, Improper body mechanics, Pain, Postural dysfunction, Impaired flexibility, Decreased strength  Visit Diagnosis: Chronic midline low back pain without sciatica  Abnormal posture     Problem List Patient Active Problem List   Diagnosis Date Noted  . Sleep apnea 07/22/2012    Dorene Ar, PTA 07/13/2020, 10:19 AM  [facility] [address] [place], [name], [address] Phone: [phone]   Fax:  [phone]  Name: Ashlee Wong MRN: [id number] Date of Birth: Jun 04, 1946

## 2020-07-17 ENCOUNTER — Encounter: Payer: Self-pay | Admitting: Physical Therapy

## 2020-07-17 ENCOUNTER — Ambulatory Visit: Payer: Medicare Other | Admitting: Physical Therapy

## 2020-07-17 ENCOUNTER — Other Ambulatory Visit: Payer: Self-pay

## 2020-07-17 DIAGNOSIS — G8929 Other chronic pain: Secondary | ICD-10-CM

## 2020-07-17 DIAGNOSIS — R293 Abnormal posture: Secondary | ICD-10-CM

## 2020-07-17 DIAGNOSIS — M545 Low back pain: Secondary | ICD-10-CM | POA: Diagnosis not present

## 2020-07-17 NOTE — Therapy (Signed)
[place], [name], [address] Phone: [phone]   Fax:  [phone]  Physical Therapy Treatment  Patient Details  Name: Ashlee Wong MRN: [id number] Date of Birth: [birth year] Referring Provider (PT): Dr. Lucianne Lei   Encounter Date: 07/17/2020   PT End of Session - 07/17/20 0849    Visit Number 10    Number of Visits 12    Date for PT Re-Evaluation 07/20/20    Authorization Type BCBS SHP; BCBS MCR, next progess note by visit 18    Progress Note Due on Visit 10    PT Start Time 0845    PT Stop Time 0929    PT Time Calculation (min) 44 min           Past Medical History:  Diagnosis Date  . Glaucoma   . Hypertension   . Type 2 diabetes mellitus ([place])     Past Surgical History:  Procedure Laterality Date  . ABDOMINAL HYSTERECTOMY    . CATARACT EXTRACTION, BILATERAL      There were no vitals filed for this visit.   Subjective Assessment - 07/17/20 0848    Subjective I was a little tired but i did okay after last session. I feel a little pressure across my shoulder blades but it is not pain.    Currently in Pain? No/denies                             [facility] Adult PT Treatment/Exercise - 07/17/20 0001      Self-Care   Self-Care Posture    Posture Practiced standing at sink with hip hinge and chin tuck attempt neutral alignment while washing dishes.       Lumbar Exercises: Stretches   Lower Trunk Rotation Limitations brief holds x 10 reps bilat. with legs on 55 cm P-ball    Pelvic Tilt 15 reps    Pelvic Tilt Limitations 5 second holds      Lumbar Exercises: Standing   Other Standing Lumbar Exercises gait on stairs 12 steps with good safety and bil rails, no pain, then 10 steps ups Rt/Lt at 6 inch     Other Standing Lumbar Exercises Sit-stand x 10 with cues for controlled descent       Lumbar Exercises: Supine   Bridge 20 reps      Shoulder Exercises: Seated    Horizontal ABduction 20 reps    Theraband Level (Shoulder Horizontal ABduction) Level 2 (Red)    External Rotation 20 reps    Theraband Level (Shoulder External Rotation) Level 2 (Red)      Shoulder Exercises: Standing   Row Strengthening;Both;20 reps;Theraband    Theraband Level (Shoulder Row) Level 4 (Blue)    Other Standing Exercises low trap setting 2x10   back to wall. then wall slides with lift off                    PT Short Term Goals - 07/03/20 1002      PT SHORT TERM GOAL #1   Title Pt will be I with initial HEP    Time 2    Period Weeks    Status Achieved    Target Date 06/22/20      PT SHORT TERM GOAL #2   Title Pt will be able to sit x 30 min with 50% less difficulty to use the computer or eat dinner  Time 2    Period Weeks    Status Achieved    Target Date 06/22/20             PT Long Term Goals - 07/17/20 0855      PT LONG TERM GOAL #1   Title pt will report </= 3/10 LBP when resting at night    Baseline no LBP, resting better now.    Time 6    Period Weeks    Status Achieved      PT LONG TERM GOAL #2   Title foto score will be improve to 48% limitation    Baseline will check again at DC next visit    Time 6    Period Weeks    Status On-going      PT LONG TERM GOAL #3   Title Pt will be able to sit x 1 hour with < 3/10 LBP to use her phone or computer    Baseline can tolerate 30 minutes then pain can rise to 8/10.    Time 6    Period Weeks    Status On-going      PT LONG TERM GOAL #4   Title Pt will be able to stand x 30 min with <3/10 LBP to perform ADLS    Baseline 25 min with pain 3-4/10    Time 6    Period Weeks    Status On-going                 Plan - 07/17/20 0850    Clinical Impression Statement Pt reports much improved pain when she raises her book work area up and focuses on her posture. She was able to tolerate sitting for 30 minutes without increased pain. Reviewed seated posture exercises for her to do  frequently during the day, especially with sitting. Standing tolerance limited to 25 minutes. Reviewed mechanics and posture with washing dishes. Practiced stair climbing and she was able to complete 12 steps with god safety and no c/o pain.    PT Next Visit Plan review and discharge, ask how washing dishes went.    PT Home Exercise Plan Access Code [email or web address]           Patient will benefit from skilled therapeutic intervention in order to improve the following deficits and impairments:  Decreased mobility, Improper body mechanics, Pain, Postural dysfunction, Impaired flexibility, Decreased strength  Visit Diagnosis: Chronic midline low back pain without sciatica  Abnormal posture     Problem List Patient Active Problem List   Diagnosis Date Noted  . Sleep apnea 07/22/2012    Dorene Ar, PTA 07/17/2020, 9:29 AM  [facility] [address] [place], [name], [address] Phone: [phone]   Fax:  [phone]  Name: Ashlee Wong MRN: [id number] Date of Birth: 02-10-1946

## 2020-07-20 ENCOUNTER — Encounter: Payer: Self-pay | Admitting: Physical Therapy

## 2020-07-20 ENCOUNTER — Ambulatory Visit: Payer: Medicare Other | Admitting: Physical Therapy

## 2020-07-20 ENCOUNTER — Other Ambulatory Visit: Payer: Self-pay

## 2020-07-20 DIAGNOSIS — R293 Abnormal posture: Secondary | ICD-10-CM

## 2020-07-20 DIAGNOSIS — G8929 Other chronic pain: Secondary | ICD-10-CM

## 2020-07-20 DIAGNOSIS — M545 Low back pain: Secondary | ICD-10-CM | POA: Diagnosis not present

## 2020-07-20 NOTE — Therapy (Addendum)
[place], [name], [address] Phone: [phone]   Fax:  [phone]  Physical Therapy Treatment and Discharge  Patient Details  Name: Ashlee Wong MRN: [id number] Date of Birth: 05-01-1946 Referring Provider (PT): Dr. Lucianne Lei   PHYSICAL THERAPY DISCHARGE SUMMARY  Visits from Start of Care: 11  Current functional level related to goals / functional outcomes: See below   Remaining deficits: See goals   Education / Equipment: See below  Plan: Patient agrees to discharge.  Patient goals were partially met. Patient is being discharged due to meeting the stated rehab goals.  ?????        Encounter Date: 07/20/2020   PT End of Session - 07/20/20 1021    Visit Number 11    Number of Visits 12    Date for PT Re-Evaluation 07/20/20    Authorization Type BCBS SHP; BCBS MCR, next progess note by visit 18    PT Start Time 1015    PT Stop Time 1057    PT Time Calculation (min) 42 min           Past Medical History:  Diagnosis Date  . Glaucoma   . Hypertension   . Type 2 diabetes mellitus ([place])     Past Surgical History:  Procedure Laterality Date  . ABDOMINAL HYSTERECTOMY    . CATARACT EXTRACTION, BILATERAL      There were no vitals filed for this visit.   Subjective Assessment - 07/20/20 1017    Subjective I am feeling good today, not pain.    Currently in Pain? No/denies              [facility] PT Assessment - 07/20/20 0001      Observation/Other Assessments   Focus on Therapeutic Outcomes (FOTO)  46% limited                          [facility] Adult PT Treatment/Exercise - 07/20/20 0001      Lumbar Exercises: Stretches   Lower Trunk Rotation Limitations brief holds x 10 reps bilat. with legs on 55 cm P-ball    Pelvic Tilt 15 reps    Pelvic Tilt Limitations 5 second holds      Lumbar Exercises: Aerobic   Nustep L5 x 6 min UE/LE      Lumbar Exercises:  Machines for Strengthening   Leg Press 45 lbs. 2x10   Omega leg press-bilat. LE     Lumbar Exercises: Standing   Shoulder Extension Strengthening;Both;20 reps;Theraband    Theraband Level (Shoulder Extension) Level 3 (Green)    Other Standing Lumbar Exercises gaiti on stairs x 4 up and down reciprocally good safety , no pain       Lumbar Exercises: Supine   Clam 20 reps    Clam Limitations red Theraband    Bridge 20 reps      Shoulder Exercises: Seated   Other Seated Exercises chin tuck , scap squeezes , maintain sitting posture      Shoulder Exercises: Standing   Row Strengthening;Both;20 reps;Theraband    Theraband Level (Shoulder Row) Level 4 (Blue)    Other Standing Exercises low trap setting, slides with lift off at wall                    PT Short Term Goals - 07/03/20 1002      PT SHORT TERM GOAL #1   Title Pt will  be I with initial HEP    Time 2    Period Weeks    Status Achieved    Target Date 06/22/20      PT SHORT TERM GOAL #2   Title Pt will be able to sit x 30 min with 50% less difficulty to use the computer or eat dinner    Time 2    Period Weeks    Status Achieved    Target Date 06/22/20             PT Long Term Goals - 07/20/20 1048      PT LONG TERM GOAL #1   Title pt will report </= 3/10 LBP when resting at night    Time 6    Period Weeks    Status Achieved      PT LONG TERM GOAL #2   Title foto score will be improve to 48% limitation    Baseline 46% limited    Time 6    Period Weeks    Status Achieved      PT LONG TERM GOAL #3   Title Pt will be able to sit x 1 hour with < 3/10 LBP to use her phone or computer    Baseline can tolerate 30 minutes then pain can rise to 6-7/10    Time 6    Period Weeks    Status Not Met      PT LONG TERM GOAL #4   Title Pt will be able to stand x 30 min with <3/10 LBP to perform ADLS    Baseline 25 min with pain 3-4/10    Time 6    Period Weeks    Status Not Met                  Plan - 07/20/20 1042    Clinical Impression Statement Pt reports no pain today. She is being mindful of posture with sitting activities and standing. She noted improved tolerance to washing dishes when using good body mechanics learned last session. Sitting and standing are limited to 30 minutes but she has learned to take breaks.  Pt reports overall 40% improved since starting therapy. FOTO score improved to better than predicted. LTG#2 met. Pt is agreeable to DC to HEP and will [facility] at [facility] for [facility] and [facility] as well.    PT Next Visit Plan discahrge to HEP today    PT Home Exercise Plan Access Code [email or web address]           Patient will benefit from skilled therapeutic intervention in order to improve the following deficits and impairments:  Decreased mobility, Improper body mechanics, Pain, Postural dysfunction, Impaired flexibility, Decreased strength  Visit Diagnosis: Chronic midline low back pain without sciatica  Abnormal posture     Problem List Patient Active Problem List   Diagnosis Date Noted  . Sleep apnea 07/22/2012    Dorene Ar, PTA 07/20/2020, 11:01 AM   Estill Bamberg April Gordy Levan, PT, DPT 10/16/2020, 2:33 PM  [facility] [address] [place], [name], [address] Phone: [phone]   Fax:  [phone]  Name: Ashlee Wong MRN: [id number] Date of Birth: 10/04/1946

## 2020-08-07 ENCOUNTER — Ambulatory Visit (HOSPITAL_BASED_OUTPATIENT_CLINIC_OR_DEPARTMENT_OTHER): Payer: Medicare Other | Attending: Family Medicine | Admitting: Internal Medicine

## 2020-08-07 ENCOUNTER — Ambulatory Visit (HOSPITAL_BASED_OUTPATIENT_CLINIC_OR_DEPARTMENT_OTHER): Payer: BC Managed Care – PPO | Admitting: Internal Medicine

## 2020-08-07 ENCOUNTER — Other Ambulatory Visit: Payer: Self-pay

## 2020-08-07 ENCOUNTER — Encounter (HOSPITAL_BASED_OUTPATIENT_CLINIC_OR_DEPARTMENT_OTHER): Payer: Self-pay | Admitting: Internal Medicine

## 2020-08-07 VITALS — Ht 67.0 in | Wt 175.0 lb

## 2020-08-07 DIAGNOSIS — R0683 Snoring: Secondary | ICD-10-CM | POA: Diagnosis not present

## 2020-08-07 DIAGNOSIS — I10 Essential (primary) hypertension: Secondary | ICD-10-CM | POA: Diagnosis not present

## 2020-08-07 DIAGNOSIS — R5383 Other fatigue: Secondary | ICD-10-CM | POA: Diagnosis not present

## 2020-08-07 DIAGNOSIS — G4733 Obstructive sleep apnea (adult) (pediatric): Secondary | ICD-10-CM | POA: Insufficient documentation

## 2020-08-07 DIAGNOSIS — E119 Type 2 diabetes mellitus without complications: Secondary | ICD-10-CM | POA: Diagnosis not present

## 2020-08-07 HISTORY — DX: Snoring: R06.83

## 2020-08-11 DIAGNOSIS — R0683 Snoring: Secondary | ICD-10-CM

## 2020-08-11 NOTE — Procedures (Signed)
Patient Name: Ashlee Wong, Alred Date: 08/07/2020 Gender: Female D.O.B: 11-Oct-1946 Age (years): 38 Referring Provider: Lucianne Lei Height (inches): 23 Interpreting Physician: Baird Lyons MD, ABSM Weight (lbs): 175 RPSGT: Carolin Coy BMI: 27 MRN: [id number] Neck Size: 14.00  CLINICAL INFORMATION Sleep Study Type: NPSG Indication for sleep study: Diabetes, Fatigue, Hypertension, Snoring Epworth Sleepiness Score: 14  Most recent polysomnogram dated 06/20/2012 revealed an AHI of 2.5/h.  SLEEP STUDY TECHNIQUE As per the AASM Manual for the Scoring of Sleep and Associated Events v2.3 (April 2016) with a hypopnea requiring 4% desaturations.  The channels recorded and monitored were frontal, central and occipital EEG, electrooculogram (EOG), submentalis EMG (chin), nasal and oral airflow, thoracic and abdominal wall motion, anterior tibialis EMG, snore microphone, electrocardiogram, and pulse oximetry.  MEDICATIONS Medications self-administered by patient taken the night of the study : none reported  SLEEP ARCHITECTURE The study was initiated at 10:27:43 PM and ended at 4:45:54 AM.  Sleep onset time was 11.2 minutes and the sleep efficiency was 91.6%%. The total sleep time was 346.5 minutes.  Stage REM latency was 86.0 minutes.  The patient spent 6.8%% of the night in stage N1 sleep, 76.3%% in stage N2 sleep, 0.0%% in stage N3 and 16.9% in REM.  Alpha intrusion was absent.  Supine sleep was 84.13%.  RESPIRATORY PARAMETERS The overall apnea/hypopnea index (AHI) was 8.3 per hour. There were 11 total apneas, including 5 obstructive, 5 central and 1 mixed apneas. There were 37 hypopneas and 36 RERAs.  The AHI during Stage REM sleep was 31.8 per hour.  AHI while supine was 9.9 per hour.  The mean oxygen saturation was 93.6%. The minimum SpO2 during sleep was 81.0%. moderate snoring was noted during this study.  CARDIAC DATA The 2 lead EKG demonstrated sinus  rhythm. The mean heart rate was 54.3 beats per minute. Other EKG findings include: PACs . LEG MOVEMENT DATA The total PLMS were 0 with a resulting PLMS index of 0.0. Associated arousal with leg movement index was 0.9 .  IMPRESSIONS - Mild obstructive sleep apnea occurred during this study (AHI = 8.3/h). - Insufficient early sleep and events to meet protoccol requirements for split CPAP titration. - No significant central sleep apnea occurred during this study (CAI = 0.9/h). - Mild oxygen desaturation was noted during this study (Min O2 = 81.0%). Mean sat 93.6%. - The patient snored with moderate snoring volume. - Occasional PACs were noted during this study. - Clinically significant periodic limb movements did not occur during sleep. No significant associated arousals.  DIAGNOSIS - Obstructive Sleep Apnea (G47.3)  RECOMMENDATIONS - Treatment for mild OSA is directed at symptoms. Conservative measures may include observation, weight loss and sleep position off back. Other options, including CPAP, a fitted oral appliance, or referral to a sleep specialist or ENT, would be based on clinical judgment. - Be careful with alcohol, sedatives and other CNS depressants that may worsen sleep apnea and disrupt normal sleep architecture. - Sleep hygiene should be reviewed to assess factors that may improve sleep quality. - Weight management and regular exercise should be initiated or continued if appropriate.  [Electronically signed] 08/11/2020 12:14 PM  Baird Lyons MD, [place], American Board of Sleep Medicine   NPI: [id number]                        [place] [place] [facility]  ELECTRONICALLY SIGNED ON:  08/11/2020, 12:09 PM [place]  PH: [phone]   FX: [phone] [place]

## 2020-08-13 ENCOUNTER — Ambulatory Visit: Payer: BC Managed Care – PPO | Attending: Internal Medicine

## 2020-08-13 DIAGNOSIS — Z23 Encounter for immunization: Secondary | ICD-10-CM

## 2020-08-13 NOTE — Progress Notes (Signed)
   Covid-19 Vaccination Clinic  Name:  SHENIECE RUGGLES    MRN: [id number] DOB: Feb 21, 1946  08/13/2020  Ms. Ayuso was observed post Covid-19 immunization for 15 minutes without incident. She was provided with Vaccine Information Sheet and instruction to access the V-Safe system.   Ms. Kierstead was instructed to call 911 with any severe reactions post vaccine: Marland Kitchen Difficulty breathing  . Swelling of face and throat  . A fast heartbeat  . A bad rash all over body  . Dizziness and weakness

## 2020-08-14 ENCOUNTER — Ambulatory Visit: Payer: BC Managed Care – PPO

## 2020-10-18 ENCOUNTER — Other Ambulatory Visit: Payer: Self-pay | Admitting: Family Medicine

## 2020-10-18 ENCOUNTER — Ambulatory Visit
Admission: RE | Admit: 2020-10-18 | Discharge: 2020-10-18 | Disposition: A | Discharge status: Home / Self Care | Payer: BC Managed Care – PPO | Source: Ambulatory Visit | Attending: Family Medicine | Admitting: Family Medicine

## 2020-10-18 DIAGNOSIS — M25561 Pain in right knee: Secondary | ICD-10-CM

## 2020-10-18 DIAGNOSIS — M25562 Pain in left knee: Secondary | ICD-10-CM

## 2022-04-08 ENCOUNTER — Other Ambulatory Visit: Payer: Self-pay | Admitting: Gastroenterology

## 2022-04-08 DIAGNOSIS — R140 Abdominal distension (gaseous): Secondary | ICD-10-CM

## 2022-04-08 DIAGNOSIS — R1032 Left lower quadrant pain: Secondary | ICD-10-CM

## 2022-05-02 ENCOUNTER — Ambulatory Visit
Admission: RE | Admit: 2022-05-02 | Discharge: 2022-05-02 | Disposition: A | Discharge status: Home / Self Care | Payer: Medicare Other | Source: Ambulatory Visit | Attending: Gastroenterology | Admitting: Gastroenterology

## 2022-05-02 DIAGNOSIS — R1032 Left lower quadrant pain: Secondary | ICD-10-CM

## 2022-05-02 DIAGNOSIS — R140 Abdominal distension (gaseous): Secondary | ICD-10-CM

## 2022-05-02 MED ORDER — IOPAMIDOL (ISOVUE-300) INJECTION 61%
100.0000 mL | Freq: Once | INTRAVENOUS | Status: AC | PRN
  Administered 2022-05-02: 100 mL via INTRAVENOUS

## 2022-07-19 IMAGING — CR DG KNEE COMPLETE 4+V*L*
4 series · 4 of 4 positions shown · non-contrast
Comparison: None.

CLINICAL DATA: Bilateral knee pain, right greater than left. No
known injury.

EXAM:
LEFT KNEE - COMPLETE 4+ VIEW

[t knee ap left]
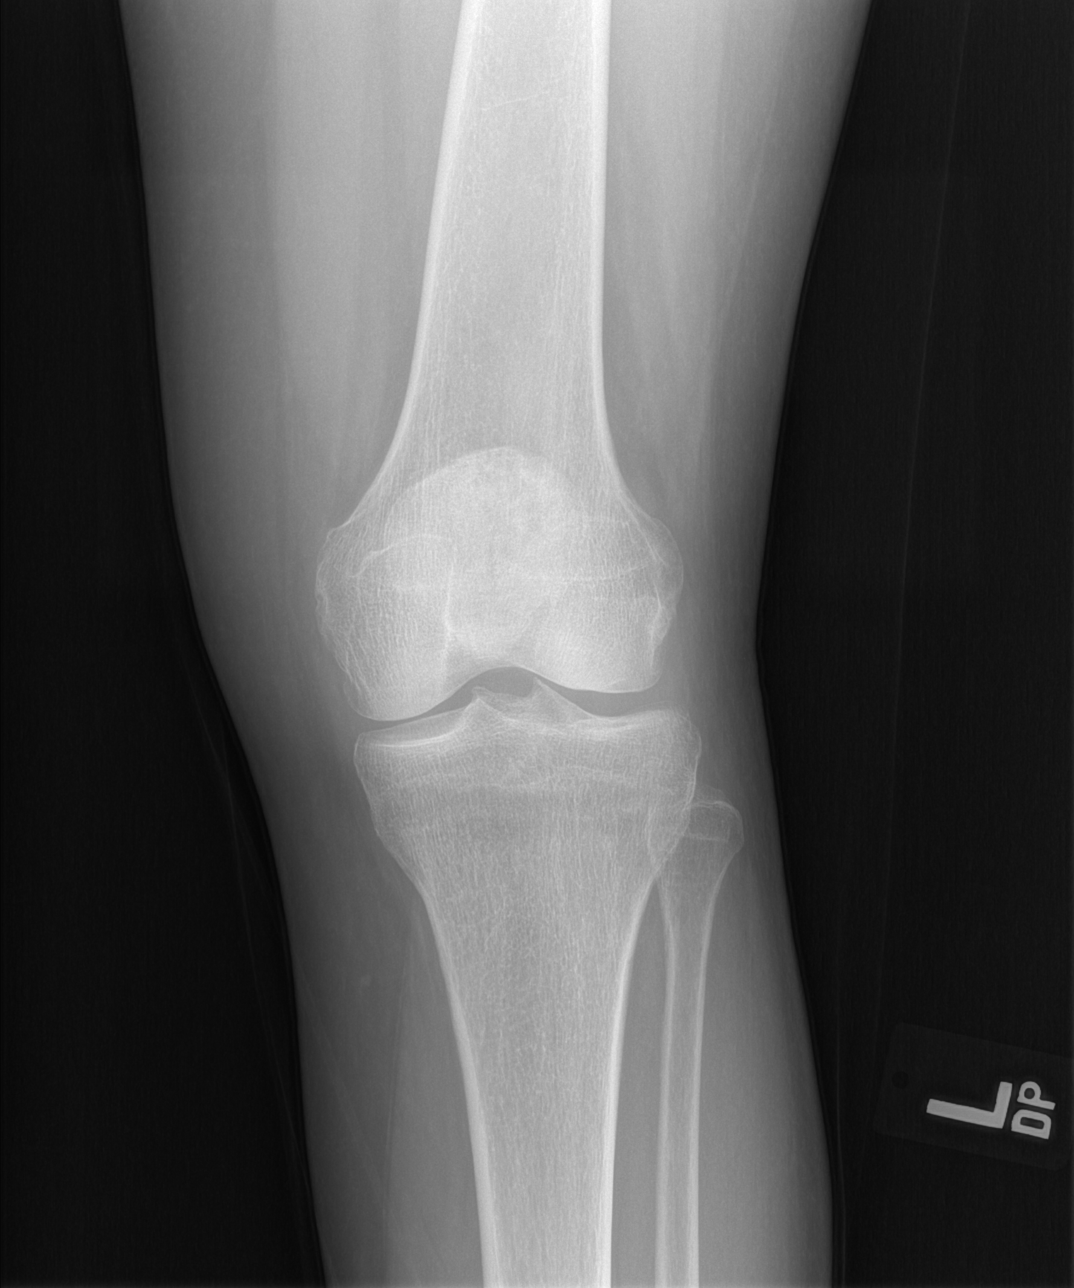

[t knee oblique left (1 of 2)]
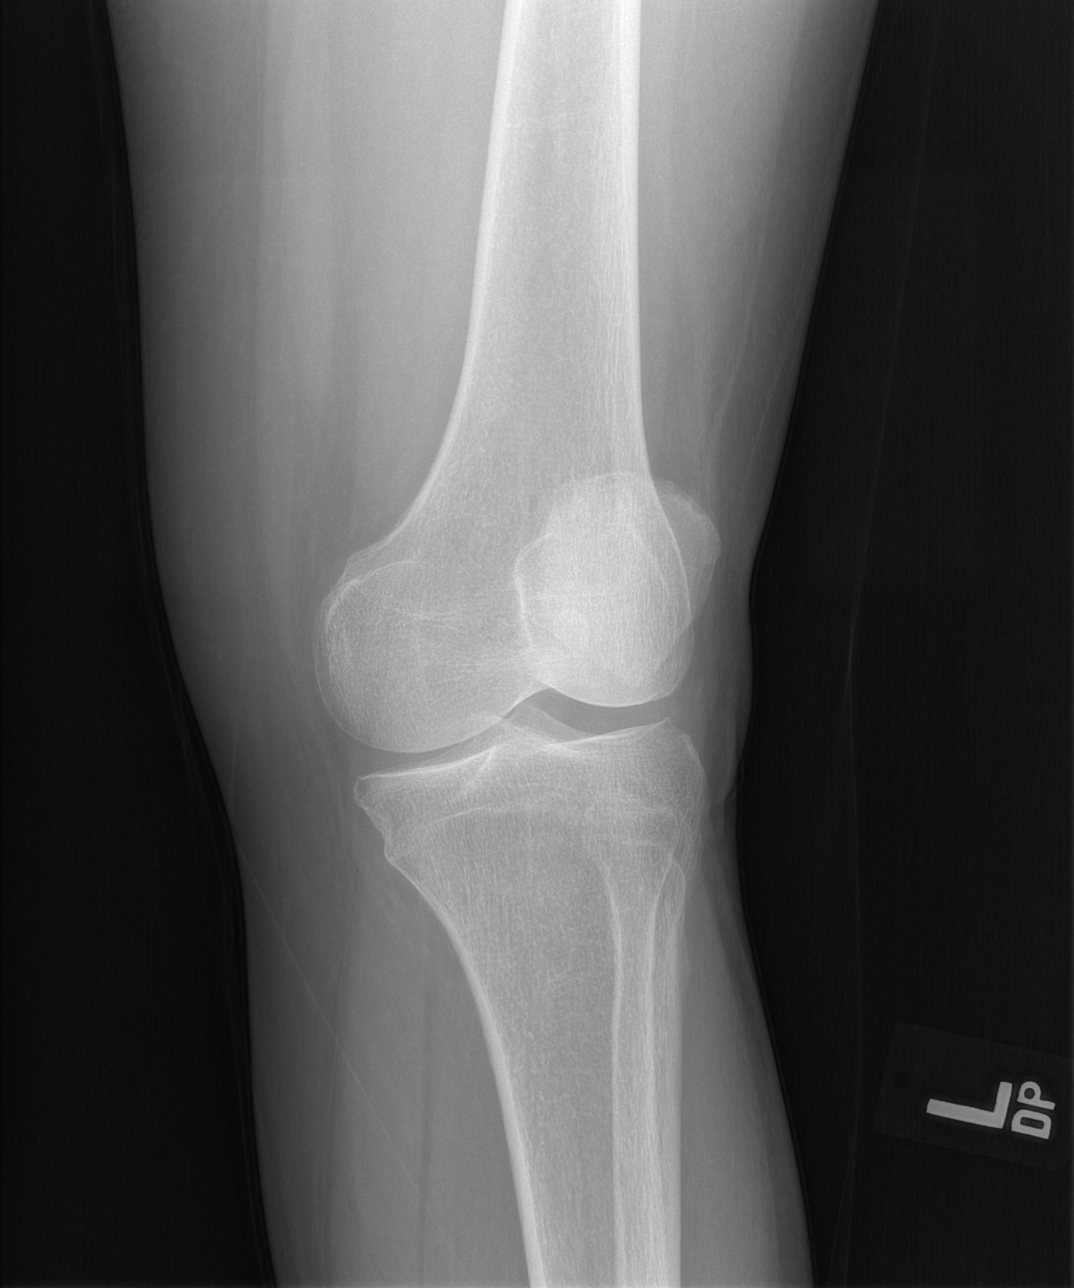

[t knee oblique left (2 of 2)]
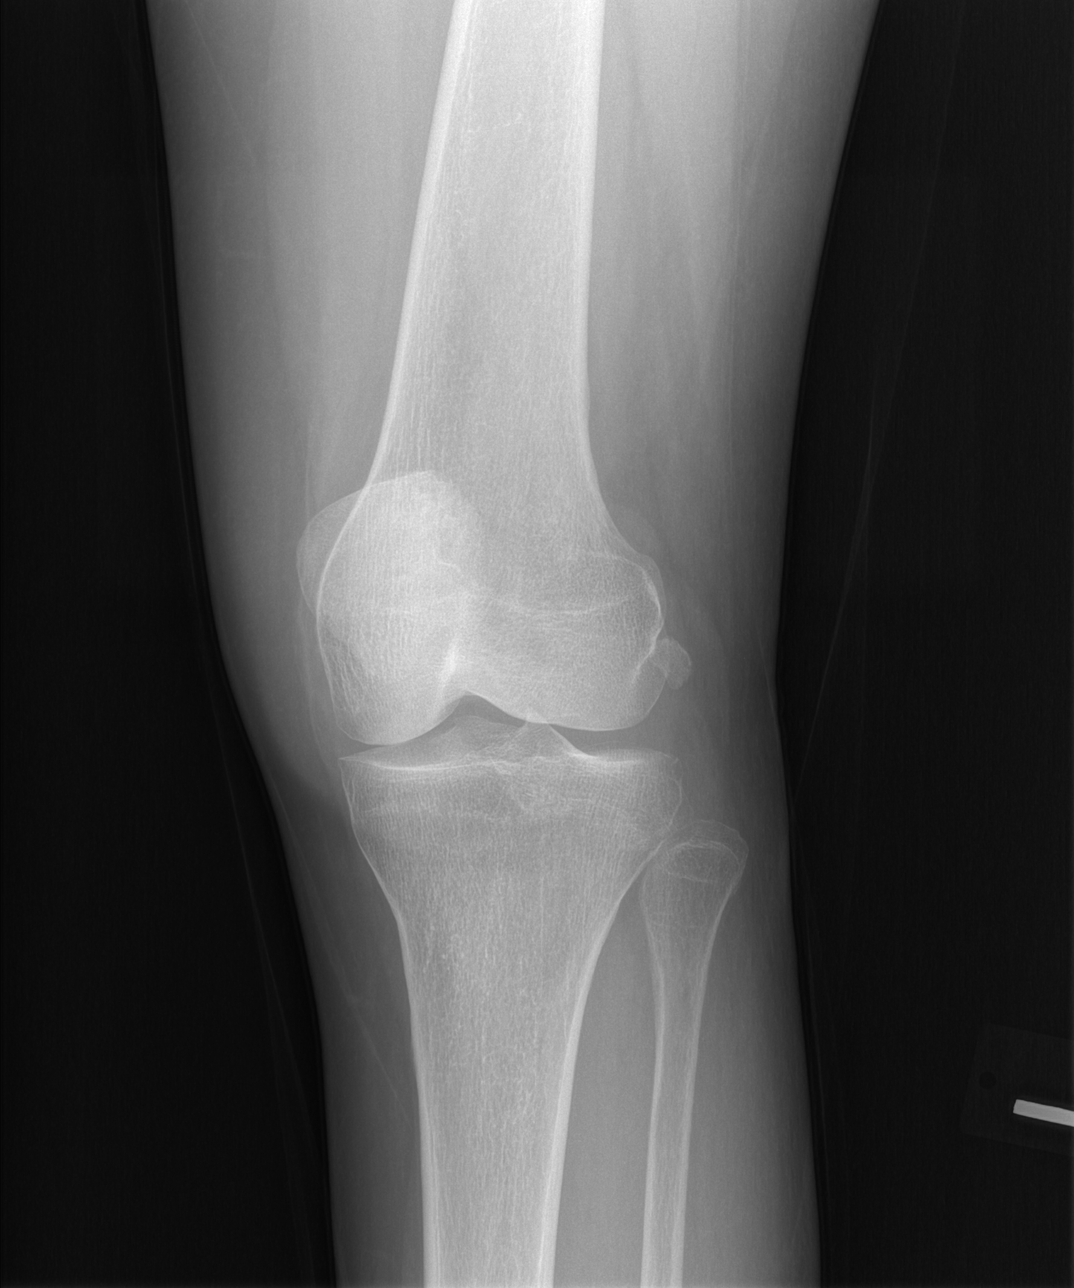

[t knee lat left]
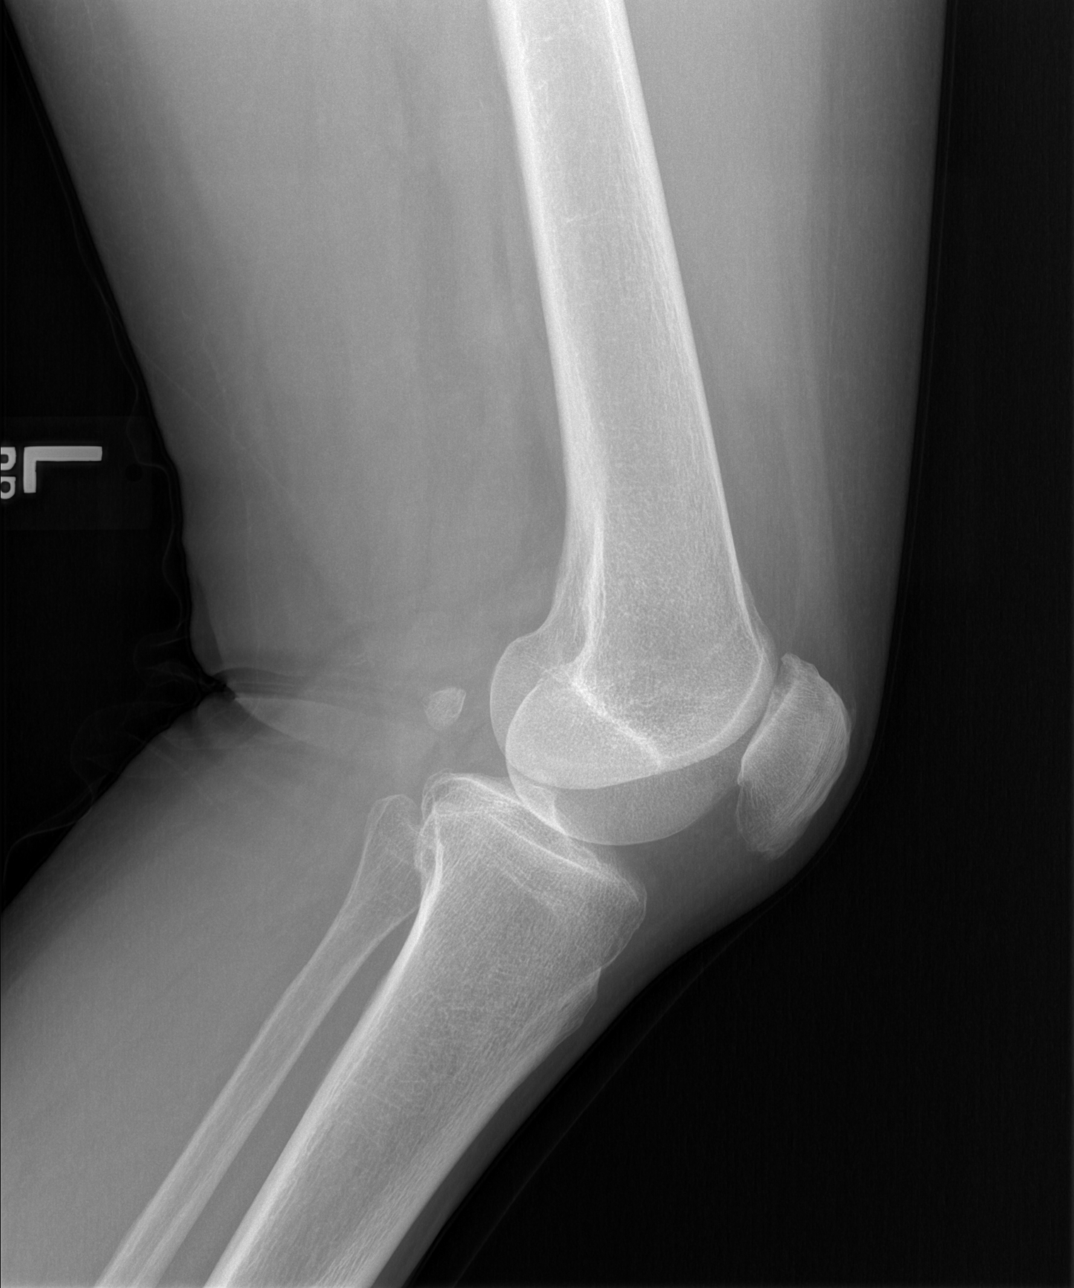

[4 of 4 positions shown; findings below may reference images not displayed]

FINDINGS: Normal alignment and joint spaces. Minimal spurring of the tibial
spines. Mild patellofemoral spurring. Small quadriceps and patellar
tendon enthesophytes. No fracture, erosion, or evidence of bony
destruction. No knee joint effusion. No focal soft tissue
abnormality.
IMPRESSION: Mild degenerative patellofemoral spurring. Small quadriceps and
patellar tendon enthesophytes.

## 2022-10-08 ENCOUNTER — Ambulatory Visit (HOSPITAL_COMMUNITY)
Admission: EM | Admit: 2022-10-08 | Discharge: 2022-10-08 | Disposition: A | Discharge status: Home / Self Care | Payer: Medicare Other | Attending: Internal Medicine | Admitting: Internal Medicine

## 2022-10-08 ENCOUNTER — Ambulatory Visit (HOSPITAL_BASED_OUTPATIENT_CLINIC_OR_DEPARTMENT_OTHER): Admit: 2022-10-08 | Discharge: 2022-10-08 | Disposition: A | Discharge status: Home / Self Care | Payer: Medicare Other

## 2022-10-08 ENCOUNTER — Encounter (HOSPITAL_COMMUNITY): Payer: Self-pay | Admitting: Emergency Medicine

## 2022-10-08 DIAGNOSIS — M7989 Other specified soft tissue disorders: Secondary | ICD-10-CM

## 2022-10-08 DIAGNOSIS — R600 Localized edema: Secondary | ICD-10-CM | POA: Diagnosis not present

## 2022-10-08 DIAGNOSIS — M79605 Pain in left leg: Secondary | ICD-10-CM | POA: Insufficient documentation

## 2022-10-08 LAB — BASIC METABOLIC PANEL
Anion gap: 8 (ref 5–15)
BUN: 11 mg/dL (ref 8–23)
CO2: 28 mmol/L (ref 22–32)
Calcium: 9.1 mg/dL (ref 8.9–10.3)
Chloride: 105 mmol/L (ref 98–111)
Creatinine, Ser: 0.7 mg/dL (ref 0.44–1.00)
GFR, Estimated: >60 mL/min (ref >60)
Glucose, Bld: 90 mg/dL (ref 70–99)
Potassium: 3.7 mmol/L (ref 3.5–5.1)
Sodium: 141 mmol/L (ref 135–145)

## 2022-10-08 LAB — CBC
HCT: 35.8 % — ABNORMAL LOW (ref 36.0–46.0)
Hemoglobin: 12.8 g/dL (ref 12.0–15.0)
MCH: 27.9 pg (ref 26.0–34.0)
MCHC: 35.8 g/dL (ref 30.0–36.0)
MCV: 78 fL — ABNORMAL LOW (ref 80.0–100.0)
Platelets: 371 10*3/uL (ref 150–400)
RBC: 4.59 MIL/uL (ref 3.87–5.11)
RDW: 13.4 % (ref 11.5–15.5)
WBC: 6.8 10*3/uL (ref 4.0–10.5)
nRBC: 0 % (ref 0.0–0.2)

## 2022-10-08 MED ORDER — ACETAMINOPHEN 325 MG PO TABS
ORAL_TABLET | ORAL | Status: AC
  Filled 2022-10-08: qty 3

## 2022-10-08 MED ORDER — ACETAMINOPHEN 325 MG PO TABS
975.0000 mg | ORAL_TABLET | Freq: Once | ORAL | Status: AC
  Administered 2022-10-08: 975 mg via ORAL

## 2022-10-08 NOTE — Progress Notes (Signed)
Lower extremity venous duplex has been completed.   Preliminary results in CV Proc.   Ashlee Wong 10/08/2022 11:54 AM

## 2022-10-08 NOTE — ED Triage Notes (Signed)
Pt c/o left leg swelling that started two days ago. Denies falls or injuries. Pt reports PCP states pt has neuropathy in that leg due to pain and numbness.

## 2022-10-09 NOTE — ED Provider Notes (Signed)
[facility]    CSN: [id number] Arrival date & time: 10/08/22  1245      History   Chief Complaint Chief Complaint  Patient presents with   Leg Swelling    HPI Ashlee Wong is a 76 y.o. female.   Patient presents urgent care for evaluation of left leg pain and swelling that started 2 days ago.  Pain is mostly to the quadricep/upper thigh region of the left leg.  Patient denies recent long periods of travel or long periods of sitting down without moving.  Denies popliteal and calf pain bilaterally.  No redness, warmth, other color change, or temperature change to the left leg compared to the right. Neuropathy to bilateral lower extremities at baseline due to diabetes. Denies history of vascular problems to the bilateral lower extremities.  No recent surgeries, shortness of breath, chest pain, weakness, changes in gait, recent falls, or blood thinner use.  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  No history of cardiomyopathy, CHF, pulmonary hypertension, or any other heart or lung condition.  She has never experienced swelling like this to the bilateral lower extremities in the past. No attempted use of any over the counter interventions prior to arrival at urgent care, does not wear compression stockings.      Past Medical History:  Diagnosis Date   Glaucoma    Hypertension    Snoring 08/07/2020   Type 2 diabetes mellitus [facility])     Patient Active Problem List   Diagnosis Date Noted   Snoring 08/07/2020   Sleep apnea 07/22/2012    Past Surgical History:  Procedure Laterality Date   ABDOMINAL HYSTERECTOMY     CATARACT EXTRACTION, BILATERAL      OB History   No obstetric history on file.      Home Medications    Prior to Admission medications   Medication Sig Start Date End Date Taking? Authorizing Provider  dorzolamide (TRUSOPT) 2 % ophthalmic solution Apply 1 drop to eye 2 (two) times daily. 12/26/16  Yes [provider]  latanoprost (XALATAN) 0.005 % ophthalmic solution Place 1 drop into both eyes at bedtime. 09/28/19  Yes [provider]  timolol (TIMOPTIC) 0.5 % ophthalmic solution Place 1 drop into both eyes 2 (two) times daily. 09/08/17  Yes [provider]  brimonidine (ALPHAGAN) 0.2 % ophthalmic solution Place 1 drop into the left eye 2 (two) times daily. Patient not taking: Reported on 06/08/2020    [provider]  carvedilol (COREG) 6.25 MG tablet Take 6.25 mg by mouth daily.    [provider]  dorzolamide-timolol (COSOPT) 22.3-6.8 MG/ML ophthalmic solution Place 1 drop into the left eye 2 (two) times daily.    [provider]  JANUVIA 100 MG tablet Take 100 mg by mouth daily.    [provider]  linagliptin (TRADJENTA) 5 MG TABS tablet Take 5 mg by mouth daily. Patient not taking: Reported on 06/08/2020    [provider]  nebivolol (BYSTOLIC) 5 MG tablet Take 5 mg by mouth daily. Patient not taking: Reported on 06/08/2020    [provider]    Family History Family History  Problem Relation Age of Onset   Heart disease Father    Cancer Mother     Social History Social History   Tobacco Use   Smoking status: Never  Substance Use Topics   Alcohol use: No   Drug use: No     Allergies   Patient  has no known allergies.   Review of Systems Review of Systems Per HPI  Physical Exam Triage Vital Signs ED Triage Vitals  Enc Vitals Group     BP 10/08/22 0949 135/84     Pulse Rate 10/08/22 0949 75     Resp 10/08/22 0949 18     Temp 10/08/22 0949 98.5 F (36.9 C)     Temp Source 10/08/22 0949 Oral     SpO2 10/08/22 0949 96 %     Weight --      Height --      Head Circumference --      Peak Flow --      Pain Score 10/08/22 0946 10     Pain Loc --      Pain Edu? --      Excl. in [address]? --    No data found.  Updated Vital Signs BP 135/84 (BP Location: Left Arm)   Pulse 75   Temp 98.5 F (36.9  C) (Oral)   Resp 18   SpO2 96%   Visual Acuity Right Eye Distance:   Left Eye Distance:   Bilateral Distance:    Right Eye Near:   Left Eye Near:    Bilateral Near:     Physical Exam Vitals and nursing note reviewed.  Constitutional:      Appearance: She is not ill-appearing or toxic-appearing.  HENT:     Head: Normocephalic and atraumatic.     Right Ear: Hearing and external ear normal.     Left Ear: Hearing and external ear normal.     Nose: Nose normal.     Mouth/Throat:     Lips: Pink.  Eyes:     General: Lids are normal. Vision grossly intact. Gaze aligned appropriately.     Extraocular Movements: Extraocular movements intact.     Conjunctiva/sclera: Conjunctivae normal.  Cardiovascular:     Rate and Rhythm: Normal rate and regular rhythm.     Heart sounds: Normal heart sounds, S1 normal and S2 normal.     Comments: Slight edema to the right lower extremity.  +1 pitting edema to the left ankle/left calf.  Entire left lower extremity appears swollen compared to the right lower extremity.  No color changes, skin intact.  +2 popliteal pulses bilaterally, +1 dorsalis pedis pulses bilaterally.  Bevelyn Buckles' sign negative bilaterally.  No warmth or erythema to bilateral lower extremities. Pulmonary:     Effort: Pulmonary effort is normal. No respiratory distress.     Breath sounds: Normal breath sounds and air entry.  Musculoskeletal:     Cervical back: Neck supple.     Right lower leg: Edema present.     Left lower leg: 1+ Edema present.  Skin:    General: Skin is warm and dry.     Capillary Refill: Capillary refill takes less than 2 seconds.     Findings: No rash.  Neurological:     General: No focal deficit present.     Mental Status: She is alert and oriented to person, place, and time. Mental status is at baseline.     Cranial Nerves: No dysarthria or facial asymmetry.  Psychiatric:        Mood and Affect: Mood normal.        Speech: Speech normal.        Behavior:  Behavior normal.        Thought Content: Thought content normal.        Judgment: Judgment normal.  UC Treatments / Results  Labs (all labs ordered are listed, but only abnormal results are displayed) Labs Reviewed  CBC - Abnormal; Notable for the following components:      Result Value   HCT 35.8 (*)    MCV 78.0 (*)    All other components within normal limits  BASIC METABOLIC PANEL    EKG   Radiology VAS [place] LOWER EXTREMITY VENOUS (DVT)  Result Date: 10/08/2022  Lower Venous DVT Study Patient Name:  ASHMI BLAS  Date of Exam:   10/08/2022 Medical Rec #: [id number]          Accession #:    [id number] Date of Birth: Oct 23, 1946          Patient Gender: F Patient Age:   53 years Exam Location:  [facility] Procedure:      VAS [place] LOWER EXTREMITY VENOUS (DVT) Referring Phys: Lenard Forth Bhavya Grand --------------------------------------------------------------------------------  Indications: Swelling, and Edema.  Comparison Study: no prior Performing Technologist: Archie Patten RVS  Examination Guidelines: A complete evaluation includes B-mode imaging, spectral Doppler, color Doppler, and power Doppler as needed of all accessible portions of each vessel. Bilateral testing is considered an integral part of a complete examination. Limited examinations for reoccurring indications may be performed as noted. The reflux portion of the exam is performed with the patient in reverse Trendelenburg.  +-----+---------------+---------+-----------+----------+--------------+ RIGHTCompressibilityPhasicitySpontaneityPropertiesThrombus Aging +-----+---------------+---------+-----------+----------+--------------+ CFV  Full           Yes      Yes                                 +-----+---------------+---------+-----------+----------+--------------+   +---------+---------------+---------+-----------+----------+--------------+ LEFT      CompressibilityPhasicitySpontaneityPropertiesThrombus Aging +---------+---------------+---------+-----------+----------+--------------+ CFV      Full           Yes      Yes                                 +---------+---------------+---------+-----------+----------+--------------+ SFJ      Full                                                        +---------+---------------+---------+-----------+----------+--------------+ FV Prox  Full                                                        +---------+---------------+---------+-----------+----------+--------------+ FV Mid   Full                                                        +---------+---------------+---------+-----------+----------+--------------+ FV DistalFull                                                        +---------+---------------+---------+-----------+----------+--------------+  PFV      Full                                                        +---------+---------------+---------+-----------+----------+--------------+ POP      Full           Yes      Yes                                 +---------+---------------+---------+-----------+----------+--------------+ PTV      Full                                                        +---------+---------------+---------+-----------+----------+--------------+ PERO     Full           Yes      Yes                                 +---------+---------------+---------+-----------+----------+--------------+     Summary: RIGHT: - No evidence of common femoral vein obstruction.  LEFT: - There is no evidence of deep vein thrombosis in the lower extremity.  - No cystic structure found in the popliteal fossa.  *See table(s) above for measurements and observations. Electronically signed by Harold Barban MD on 10/08/2022 at 58:44:12 PM.    Final     Procedures Procedures (including critical care time)  Medications Ordered in UC Medications   acetaminophen (TYLENOL) tablet 975 mg (975 mg Oral Given 10/08/22 1058)    Initial Impression / Assessment and Plan / UC Course  I have reviewed the triage vital signs and the nursing notes.  Pertinent labs & imaging results that were available during my care of the patient were reviewed by me and considered in my medical decision making (see chart for details).   1.  Left leg swelling and left leg pain There is concern for possible blood clot due to patient's age and risk factors despite negative Homan signs bilaterally and stable neurovascular exam.  We will proceed with the venous ultrasound to rule out DVT.  If DVT study is negative, patient to use compression stockings purchased at medical discount supply store and elevate the bilateral lower extremities to reduce edema.  PCP follow-up recommended in the next few days for further evaluation and workup.  Deferred imaging of the chest based on stable cardiopulmonary exam and hemodynamically stable vital signs.  Patient may take Tylenol at 1000 mg every 6 hours as needed for pain and swelling to the left lower extremity.  Dose given in clinic.  Patient discharged to [facility] radiology to have vascular study completed.  Will call patient with results if there is change in treatment plan.   Discussed physical exam and available lab work findings in clinic with patient.  Counseled patient regarding appropriate use of medications and potential side effects for all medications recommended or prescribed today. Discussed red flag signs and symptoms of worsening condition,when to call the PCP office, return to urgent care, and when to seek higher level of care in the emergency  department. Patient verbalizes understanding and agreement with plan. All questions answered. Patient discharged in stable condition.    Final Clinical Impressions(s) / UC Diagnoses   Final diagnoses:  Left leg swelling  Left leg pain   Discharge Instructions   None     ED Prescriptions   None    PDMP not reviewed this encounter.   Talbot Grumbling, FNP 10/09/22 0830

## 2022-10-30 ENCOUNTER — Ambulatory Visit: Payer: Medicare Other | Admitting: Podiatry

## 2022-11-04 ENCOUNTER — Ambulatory Visit: Payer: Medicare Other | Admitting: Podiatry

## 2022-11-07 ENCOUNTER — Other Ambulatory Visit: Payer: Self-pay | Admitting: *Deleted

## 2022-11-07 DIAGNOSIS — R209 Unspecified disturbances of skin sensation: Secondary | ICD-10-CM

## 2022-11-11 ENCOUNTER — Ambulatory Visit (HOSPITAL_COMMUNITY)
Admission: RE | Admit: 2022-11-11 | Discharge: 2022-11-11 | Disposition: A | Discharge status: Home / Self Care | Payer: BC Managed Care – PPO | Source: Ambulatory Visit | Attending: Vascular Surgery | Admitting: Vascular Surgery

## 2022-11-11 ENCOUNTER — Encounter: Payer: Self-pay | Admitting: Vascular Surgery

## 2022-11-11 ENCOUNTER — Ambulatory Visit (INDEPENDENT_AMBULATORY_CARE_PROVIDER_SITE_OTHER): Payer: Medicare Other | Admitting: Vascular Surgery

## 2022-11-11 VITALS — BP 156/82 | HR 60 | Temp 98.0°F | Ht 67.0 in | Wt 140.0 lb

## 2022-11-11 DIAGNOSIS — G629 Polyneuropathy, unspecified: Secondary | ICD-10-CM | POA: Diagnosis not present

## 2022-11-11 DIAGNOSIS — R209 Unspecified disturbances of skin sensation: Secondary | ICD-10-CM | POA: Diagnosis present

## 2022-11-11 NOTE — Progress Notes (Signed)
Patient name: Ashlee Wong MRN: [id number] DOB: 04-13-1946 Sex: female  REASON FOR CONSULT: Polyneuropathy and cold feet  HPI: Ashlee Wong is a 77 y.o. female, history of hypertension and type 2 diabetes that presents for evaluation of cold feet.  She states she has had this sensation for at least the last 3 to 4 months.  Worse at night.  Equal in both feet.  Also has a pins and needles sensation in both feet.    She does not have any pain.  No discomfort in her legs when walking.  Past Medical History:  Diagnosis Date   Glaucoma    Hypertension    Snoring 08/07/2020   Type 2 diabetes mellitus ([place])     Past Surgical History:  Procedure Laterality Date   ABDOMINAL HYSTERECTOMY     CATARACT EXTRACTION, BILATERAL      Family History  Problem Relation Age of Onset   Heart disease Father    Cancer Mother     SOCIAL HISTORY: Social History   Socioeconomic History   Marital status: Widowed    Spouse name: Not on file   Number of children: Not on file   Years of education: Not on file   Highest education level: Not on file  Occupational History   Occupation: retired Product manager: RETIRED  Tobacco Use   Smoking status: Never   Smokeless tobacco: Not on file  Substance and Sexual Activity   Alcohol use: No   Drug use: No   Sexual activity: Not on file  Other Topics Concern   Not on file  Social History Narrative   Not on file   Social Determinants of Health   Financial Resource Strain: Not on file  Food Insecurity: Not on file  Transportation Needs: Not on file  Physical Activity: Not on file  Stress: Not on file  Social Connections: Not on file  Intimate Partner Violence: Not on file    No Known Allergies  Current Outpatient Medications  Medication Sig Dispense Refill   carvedilol (COREG) 6.25 MG tablet Take 6.25 mg by mouth daily.     dorzolamide (TRUSOPT) 2 % ophthalmic solution Apply 1 drop to eye 2 (two) times daily.      dorzolamide-timolol (COSOPT) 22.3-6.8 MG/ML ophthalmic solution Place 1 drop into the left eye 2 (two) times daily.     JANUVIA 100 MG tablet Take 100 mg by mouth daily.     latanoprost (XALATAN) 0.005 % ophthalmic solution Place 1 drop into both eyes at bedtime.     linagliptin (TRADJENTA) 5 MG TABS tablet Take 5 mg by mouth daily.     nebivolol (BYSTOLIC) 5 MG tablet Take 5 mg by mouth daily.     timolol (TIMOPTIC) 0.5 % ophthalmic solution Place 1 drop into both eyes 2 (two) times daily.     No current facility-administered medications for this visit.    REVIEW OF SYSTEMS:  '[X]'$  denotes positive finding, '[ ]'$  denotes negative finding Cardiac  Comments:  Chest pain or chest pressure:    Shortness of breath upon exertion:    Short of breath when lying flat:    Irregular heart rhythm:        Vascular    Pain in calf, thigh, or hip brought on by ambulation:    Pain in feet at night that wakes you up from your sleep:     Blood clot in your veins:    Leg swelling:  Pulmonary    Oxygen at home:    Productive cough:     Wheezing:         Neurologic    Sudden weakness in arms or legs:     Sudden numbness in arms or legs:     Sudden onset of difficulty speaking or slurred speech:    Temporary loss of vision in one eye:     Problems with dizziness:         Gastrointestinal    Blood in stool:     Vomited blood:         Genitourinary    Burning when urinating:     Blood in urine:        Psychiatric    Major depression:         Hematologic    Bleeding problems:    Problems with blood clotting too easily:        Skin    Rashes or ulcers:        Constitutional    Fever or chills:      PHYSICAL EXAM: Vitals:   11/11/22 1151  BP: (!) 156/82  Pulse: 60  Temp: 98 F (36.7 C)  TempSrc: Temporal  SpO2: 98%  Weight: 140 lb (63.5 kg)  Height: '5\' 7"'$  (1.702 m)    GENERAL: The patient is a well-nourished female, in no acute distress. The vital signs are documented  above. CARDIAC: There is a regular rate and rhythm.  VASCULAR:  Palpable femoral pulses bilaterally Palpable dorsalis and posterior tibial pulses bilaterally No lower extremity tissue loss PULMONARY: No respiratory distress. ABDOMEN: Soft and non-tender. MUSCULOSKELETAL: There are no major deformities or cyanosis. NEUROLOGIC: No focal weakness or paresthesias are detected. PSYCHIATRIC: The patient has a normal affect.  DATA:   ABI's today are 1.22 bilaterally and triphasic with no signs of arterial disease  Assessment/Plan:  77 year old female presents for evaluation of cold feet.  Discussed that she has normal ABIs greater than 1 with normal triphasic waveforms at the ankle with no signs of significant arterial disease.  She has palpable pedal pulses on exam with normal findings as well.  I do not think she has any arterial insufficiency to explain her symptoms.  Sounds like she has neuropathy in the setting of her diabetes with the pins and needles sensation.  She would like some further assistance and states she cannot tolerate her symptoms.  I will refer her to neurology for further evaluation.   Marty Heck, MD [facility] Office: [phone]

## 2022-11-13 ENCOUNTER — Ambulatory Visit (INDEPENDENT_AMBULATORY_CARE_PROVIDER_SITE_OTHER): Payer: Medicare Other | Admitting: Podiatry

## 2022-11-13 DIAGNOSIS — B351 Tinea unguium: Secondary | ICD-10-CM

## 2022-11-13 DIAGNOSIS — M79675 Pain in left toe(s): Secondary | ICD-10-CM

## 2022-11-13 DIAGNOSIS — E1142 Type 2 diabetes mellitus with diabetic polyneuropathy: Secondary | ICD-10-CM | POA: Diagnosis not present

## 2022-11-13 DIAGNOSIS — E119 Type 2 diabetes mellitus without complications: Secondary | ICD-10-CM | POA: Diagnosis not present

## 2022-11-13 DIAGNOSIS — M79674 Pain in right toe(s): Secondary | ICD-10-CM | POA: Diagnosis not present

## 2022-11-13 MED ORDER — GABAPENTIN 100 MG PO CAPS: 100.0000 mg | ORAL_CAPSULE | Freq: Three times a day (TID) | ORAL | 3 refills | Status: DC

## 2022-11-13 MED ORDER — GABAPENTIN 100 MG PO CAPS: 100.0000 mg | ORAL_CAPSULE | Freq: Every day | ORAL | 3 refills | Status: DC

## 2022-11-17 ENCOUNTER — Encounter: Payer: Self-pay | Admitting: Podiatry

## 2022-11-17 NOTE — Progress Notes (Signed)
  Subjective:  Patient ID: Ashlee Wong, female    DOB: 1946/04/01,  MRN: [id number]  Chief Complaint  Patient presents with   Nail Problem    New Patient, diabetic foot care and nail trim   Diabetes   Peripheral Neuropathy    77 y.o. female presents with the above complaint. History confirmed with patient.  Her nails are thickened elongated and she has trouble cutting them.  She is type 2 diabetes that is well-controlled.  She notes some tingling burning pain at night that keeps her awake  Objective:  Physical Exam: warm, good capillary refill, no trophic changes or ulcerative lesions, normal DP and PT pulses, and some inconsistency in monofilament exam. Left Foot: dystrophic yellowed discolored nail plates with subungual debris Right Foot: dystrophic yellowed discolored nail plates with subungual debris   Assessment:   1. Pain due to onychomycosis of toenails of both feet   2. Encounter for diabetic foot exam ([place])   3. Diabetic polyneuropathy associated with type 2 diabetes mellitus ([place])      Plan:  Patient was evaluated and treated and all questions answered. . Patient educated on diabetes. Discussed proper diabetic foot care and discussed risks and complications of disease. Educated patient in depth on reasons to return to the office immediately should he/she discover anything concerning or new on the feet. All questions answered. Discussed proper shoes as well.   We discussed diabetic polyneuropathy and the etiology and treatment options of this.  I discussed using gabapentin for this.  She has not had this yet.  Rx was sent to pharmacy for 100 mg nightly.  Discussed this can be uptitrated as needed  Discussed the etiology and treatment options for the condition in detail with the patient. Educated patient on the topical and oral treatment options for mycotic nails. Recommended debridement of the nails today. Sharp and mechanical debridement performed of all painful and  mycotic nails today. Nails debrided in length and thickness using a nail nipper to level of comfort. Discussed treatment options including appropriate shoe gear. Follow up as needed for painful nails.    Return in about 3 months (around 02/12/2023) for at risk diabetic foot care.

## 2022-11-21 ENCOUNTER — Ambulatory Visit: Payer: Medicare Other

## 2022-11-27 NOTE — Progress Notes (Addendum)
Patient presents to the office today for diabetic shoe and insole measuring.  Patient was measured with brannock device to determine size and width for 1 pair of extra depth shoes and foam casted for 3 pair of insoles.   ABN signed.   Documentation of medical necessity will be sent to patient's treating diabetic doctor to verify and sign.   Patient's diabetic provider: Dr. Lucianne Lei  Shoes and insoles will be ordered at that time and patient will be notified for an appointment for fitting when they arrive.   Brannock measurement: RIGHT/LEFT: 9.5 C  Patient shoe selection-   1st   Shoe choice:   Apex [id number]  Shoe size ordered: Women's 9.5 X-Wide

## 2022-11-28 ENCOUNTER — Ambulatory Visit: Payer: BC Managed Care – PPO | Admitting: *Deleted

## 2022-11-28 DIAGNOSIS — M2011 Hallux valgus (acquired), right foot: Secondary | ICD-10-CM

## 2022-11-28 DIAGNOSIS — E1142 Type 2 diabetes mellitus with diabetic polyneuropathy: Secondary | ICD-10-CM

## 2022-12-22 ENCOUNTER — Telehealth: Payer: Self-pay | Admitting: Podiatry

## 2022-12-22 NOTE — Telephone Encounter (Signed)
Lmom to call back to schedule picking up diabetic shoes    Auth expires on 03/10/23

## 2022-12-24 NOTE — Telephone Encounter (Signed)
Lmom to call back to schedule appt to pick up diabetic shoes

## 2022-12-30 ENCOUNTER — Encounter: Payer: Self-pay | Admitting: Neurology

## 2022-12-30 ENCOUNTER — Ambulatory Visit: Payer: Medicare Other | Admitting: Neurology

## 2022-12-30 NOTE — Telephone Encounter (Signed)
Lmom to call back to schedule picking up diabetic shoes.     Also left message on voicemail for her daughter .

## 2023-01-06 ENCOUNTER — Ambulatory Visit: Payer: Medicare Other | Admitting: Neurology

## 2023-01-06 ENCOUNTER — Encounter: Payer: Self-pay | Admitting: Neurology

## 2023-01-07 ENCOUNTER — Other Ambulatory Visit: Payer: Medicare Other

## 2023-01-14 ENCOUNTER — Other Ambulatory Visit: Payer: Medicare Other

## 2023-01-26 ENCOUNTER — Ambulatory Visit (INDEPENDENT_AMBULATORY_CARE_PROVIDER_SITE_OTHER): Payer: Medicare Other

## 2023-01-26 DIAGNOSIS — M2011 Hallux valgus (acquired), right foot: Secondary | ICD-10-CM

## 2023-01-26 DIAGNOSIS — E1142 Type 2 diabetes mellitus with diabetic polyneuropathy: Secondary | ICD-10-CM | POA: Diagnosis not present

## 2023-01-26 DIAGNOSIS — M2012 Hallux valgus (acquired), left foot: Secondary | ICD-10-CM

## 2023-01-26 NOTE — Progress Notes (Signed)
Patient presents today to pick up diabetic shoes and insoles.  Patient was dispensed 1 pair of diabetic shoes and 3 pairs of foam casted diabetic insoles.   She tried on the shoes with the insoles and the fit was satisfactory.   Will follow up next year for new order.    

## 2023-02-18 ENCOUNTER — Ambulatory Visit: Payer: Medicare Other | Admitting: Podiatry

## 2023-02-19 ENCOUNTER — Telehealth: Payer: Self-pay | Admitting: Podiatry

## 2023-02-19 NOTE — Telephone Encounter (Signed)
Patient called she does not want her diabetic shoes,  she wants to return them.  Told patient our policy on returns that she had 1 week to return shoes and the orthotics can't be returned because they are custom to her feet.  Patient wanted [place] to ask if you will let her return just the shoes?

## 2023-02-20 NOTE — Telephone Encounter (Signed)
Spoke with Dr Stacie Acres this morning about returning just the shoes, he said no it would be inappropriate (it is against our policy). I will call and let the patient know.

## 2023-02-23 ENCOUNTER — Ambulatory Visit: Payer: Medicare Other | Admitting: Neurology

## 2023-03-02 ENCOUNTER — Ambulatory Visit (INDEPENDENT_AMBULATORY_CARE_PROVIDER_SITE_OTHER): Payer: Medicare Other

## 2023-03-02 ENCOUNTER — Encounter: Payer: Self-pay | Admitting: Podiatry

## 2023-03-02 ENCOUNTER — Ambulatory Visit (INDEPENDENT_AMBULATORY_CARE_PROVIDER_SITE_OTHER): Payer: Medicare Other | Admitting: Podiatry

## 2023-03-02 DIAGNOSIS — M79674 Pain in right toe(s): Secondary | ICD-10-CM

## 2023-03-02 DIAGNOSIS — M2011 Hallux valgus (acquired), right foot: Secondary | ICD-10-CM

## 2023-03-02 DIAGNOSIS — B351 Tinea unguium: Secondary | ICD-10-CM | POA: Diagnosis not present

## 2023-03-02 DIAGNOSIS — E1142 Type 2 diabetes mellitus with diabetic polyneuropathy: Secondary | ICD-10-CM

## 2023-03-02 DIAGNOSIS — M79675 Pain in left toe(s): Secondary | ICD-10-CM

## 2023-03-02 DIAGNOSIS — M2012 Hallux valgus (acquired), left foot: Secondary | ICD-10-CM

## 2023-03-02 NOTE — Progress Notes (Signed)
Patient presented today to return diabetic shoes. She states that the insurance company did not pay for any of it and she would like to return the shoes. Because the inserts are custom the patient has been set up on a payment plan for them and she will bring them in to be trimmed to her shoes she has at home.

## 2023-03-02 NOTE — Progress Notes (Signed)
This patient returns to my office for at risk foot care.  This patient requires this care by a professional since this patient will be at risk due to having diabetic neuropathy.  This patient is unable to cut nails herself since the patient cannot reach her nails.These nails are painful walking and wearing shoes.  This patient presents for at risk foot care today.  General Appearance  Alert, conversant and in no acute stress.  Vascular  Dorsalis pedis and posterior tibial  pulses are palpable  bilaterally.  Capillary return is within normal limits  bilaterally. Temperature is within normal limits  bilaterally.  Neurologic  Senn-Weinstein monofilament wire test within normal limits  bilaterally. Muscle power within normal limits bilaterally.  Nails Thick disfigured discolored nails with subungual debris  from hallux to fifth toes bilaterally. No evidence of bacterial infection or drainage bilaterally.  Orthopedic  No limitations of motion  feet .  No crepitus or effusions noted.  No bony pathology or digital deformities noted. Significant  HAV  B/L.  Hammer toes  B/L.  Skin  normotropic skin with no porokeratosis noted bilaterally.  No signs of infections or ulcers noted.     Onychomycosis  Pain in right toes  Pain in left toes  Consent was obtained for treatment procedures.   Mechanical debridement of nails 1-5  bilaterally performed with a nail nipper.  Filed with dremel without incident.    Return office visit  3 months                    Told patient to return for periodic foot care and evaluation due to potential at risk complications.   Helane Gunther DPM

## 2023-03-05 ENCOUNTER — Ambulatory Visit (INDEPENDENT_AMBULATORY_CARE_PROVIDER_SITE_OTHER): Payer: Medicare Other | Admitting: Neurology

## 2023-03-05 ENCOUNTER — Encounter: Payer: Self-pay | Admitting: Neurology

## 2023-03-05 ENCOUNTER — Telehealth: Payer: Self-pay

## 2023-03-05 VITALS — BP 162/78 | HR 61 | Ht 66.0 in | Wt 145.0 lb

## 2023-03-05 DIAGNOSIS — G629 Polyneuropathy, unspecified: Secondary | ICD-10-CM

## 2023-03-05 DIAGNOSIS — M792 Neuralgia and neuritis, unspecified: Secondary | ICD-10-CM

## 2023-03-05 MED ORDER — GABAPENTIN 300 MG PO CAPS: 900.0000 mg | ORAL_CAPSULE | Freq: Every evening | ORAL | 11 refills | Status: AC | PRN

## 2023-03-05 NOTE — Telephone Encounter (Signed)
Called and spoke to pharmacy and stated that the 900 daily is correct and that Dr. Terrace Arabia will manage the gabapentin from now on and they voiced understanding

## 2023-03-05 NOTE — Telephone Encounter (Signed)
Please let pharmacy know, I did increase gabapentin to higher dose, from 100 to 300 mg tablets, maximum 900 mg daily

## 2023-03-05 NOTE — Telephone Encounter (Signed)
Pharmd calling to make sure we taking over gabapentin as she was getting  tid by another provider and that the rx of 3000 3 tabs at noc is appropriate. Routing to dr. Terrace Arabia to clarify

## 2023-03-05 NOTE — Progress Notes (Signed)
Chief Complaint  Patient presents with   New Patient (Initial Visit)    Rm 13, [place] (has light sensitivity) bilateral LE numbness and tingling (ongoing one year)      ASSESSMENT AND PLAN  Ashlee Wong is a 77 y.o. female   Bilateral feet paresthesia,  Long history of diabetes, most likely diabetic peripheral neuropathy  Laboratory evaluation to rule out for other treatable etiology  Tried very low-dose gabapentin 100 mg every night without significant benefit, will titrating to higher dose 300 mg 3 tablets as needed  Continue follow-up with primary care physician  DIAGNOSTIC DATA (LABS, IMAGING, TESTING) - I reviewed patient records, labs, notes, testing and imaging myself where available.   MEDICAL HISTORY:  Ashlee Wong is a 77 year old female, seen in request by her primary care physician Dr. Parke Simmers, Adrian Saran for evaluation of bilateral feet paresthesia, initial evaluation was on March 05, 2023   I reviewed and summarized the referring note. PMHX. HTN Glaucoma DM since 2014  She had diabetes for more than 10 years, since 2022, she noticed intermittent bilateral feet paresthesia, starting at the bottom of her feet, sometimes involving the top of her feet, numbness tingling, mainly at nighttime, difficulty sleeping, no urge to pace, she has tried gabapentin  qhs, no significant side effect, no benefit noted  She denies finger involvement, denies gait abnormality denies significant low back pain    PHYSICAL EXAM:   Vitals:   03/05/23 1403  BP: (!) 162/78  Pulse: 61  Weight: 145 lb (65.8 kg)  Height:  (1.676 m)   Not recorded     Body mass index is 23.4 kg/m.  PHYSICAL EXAMNIATION:  Gen: NAD, conversant, well nourised, well groomed                     Cardiovascular: Regular rate rhythm, no peripheral edema, warm, nontender. Eyes: Conjunctivae clear without exudates or hemorrhage Neck: Supple, no carotid bruits. Pulmonary: Clear to  auscultation bilaterally   NEUROLOGICAL EXAM:  MENTAL STATUS: Speech/cognition: Awake, alert, oriented to history taking and casual conversation CRANIAL NERVES: CN II: Visual fields are full to confrontation. Pupils are round equal and briskly reactive to light. CN III, IV, VI: extraocular movement are normal. No ptosis. CN V: Facial sensation is intact to light touch CN VII: Face is symmetric with normal eye closure  CN VIII: Hearing is normal to causal conversation. CN IX, X: Phonation is normal. CN XI: Head turning and shoulder shrug are intact  MOTOR: Flatfeet, no significant proximal upper or lower extremity muscle weakness,  REFLEXES: Reflexes are 1 and symmetric at the biceps, triceps, knees, and absent at ankles. Plantar responses are flexor.  SENSORY: Mildly length-dependent decreased vibratory sensation to mid shin level, decreased to pinprick to ankle level  COORDINATION: There is no trunk or limb dysmetria noted.  GAIT/STANCE: Push-up to get up from seated position, flatfeet, bilateral valgus knee, steady gait Romberg is absent.  REVIEW OF SYSTEMS:  Full 14 system review of systems performed and notable only for as above All other review of systems were negative.   ALLERGIES: No Known Allergies  HOME MEDICATIONS: Current Outpatient Medications  Medication Sig Dispense Refill   carvedilol (COREG) 6.25 MG tablet Take 6.25 mg by mouth daily.     dorzolamide (TRUSOPT) 2 % ophthalmic solution Apply 1 drop to eye 2 (two) times daily.     gabapentin (NEURONTIN) 100 MG capsule Take 1 capsule (100 mg total) by mouth at  bedtime. 90 capsule 3   JANUVIA 100 MG tablet Take 100 mg by mouth daily.     Netarsudil-Latanoprost (ROCKLATAN) 0.02-0.005 % SOLN Apply 1 drop to eye daily.     No current facility-administered medications for this visit.    PAST MEDICAL HISTORY: Past Medical History:  Diagnosis Date   Glaucoma    Hypertension    Snoring 08/07/2020   Type 2  diabetes mellitus     PAST SURGICAL HISTORY: Past Surgical History:  Procedure Laterality Date   ABDOMINAL HYSTERECTOMY     CATARACT EXTRACTION, BILATERAL      FAMILY HISTORY: Family History  Problem Relation Age of Onset   Heart disease Father    Cancer Mother     SOCIAL HISTORY: Social History   Socioeconomic History   Marital status: Widowed    Spouse name: Not on file   Number of children: 1   Years of education: Not on file   Highest education level: Not on file  Occupational History   Occupation: retired Magazine features editor: RETIRED  Tobacco Use   Smoking status: Never   Smokeless tobacco: Not on file  Substance and Sexual Activity   Alcohol use: No   Drug use: No   Sexual activity: Not Currently  Other Topics Concern   Not on file  Social History Narrative   Not on file   Social Determinants of Health   Financial Resource Strain: Not on file  Food Insecurity: Not on file  Transportation Needs: Not on file  Physical Activity: Not on file  Stress: Not on file  Social Connections: Not on file  Intimate Partner Violence: Not on file      Levert Feinstein, M.D. Ph.D.  [facility] [address] [place], [place] [address] Ph: [phone] Fax: [phone]  CC:  Renaye Rakers, MD [address] [place],  [place] [address]  Renaye Rakers, MD

## 2023-03-11 LAB — CBC WITH DIFFERENTIAL/PLATELET
Basophils Absolute: 0.1 10*3/uL (ref 0.0–0.2)
Basos: 1 %
EOS (ABSOLUTE): 0.2 10*3/uL (ref 0.0–0.4)
Eos: 3 %
Hematocrit: 35.1 % (ref 34.0–46.6)
Hemoglobin: 11.8 g/dL (ref 11.1–15.9)
Immature Grans (Abs): 0 10*3/uL (ref 0.0–0.1)
Immature Granulocytes: 0 %
Lymphocytes Absolute: 2.3 10*3/uL (ref 0.7–3.1)
Lymphs: 39 %
MCH: 27.4 pg (ref 26.6–33.0)
MCHC: 33.6 g/dL (ref 31.5–35.7)
MCV: 82 fL (ref 79–97)
Monocytes Absolute: 0.4 10*3/uL (ref 0.1–0.9)
Monocytes: 8 %
Neutrophils Absolute: 2.9 10*3/uL (ref 1.4–7.0)
Neutrophils: 49 %
Platelets: 273 10*3/uL (ref 150–450)
RBC: 4.3 x10E6/uL (ref 3.77–5.28)
RDW: 14.5 % (ref 11.7–15.4)
WBC: 5.8 10*3/uL (ref 3.4–10.8)

## 2023-03-11 LAB — MULTIPLE MYELOMA PANEL, SERUM
Albumin SerPl Elph-Mcnc: 3.6 g/dL (ref 2.9–4.4)
Albumin/Glob SerPl: 1.4 (ref 0.7–1.7)
Alpha 1: 0.3 g/dL (ref 0.0–0.4)
Alpha2 Glob SerPl Elph-Mcnc: 0.7 g/dL (ref 0.4–1.0)
B-Globulin SerPl Elph-Mcnc: 0.8 g/dL (ref 0.7–1.3)
Gamma Glob SerPl Elph-Mcnc: 0.8 g/dL (ref 0.4–1.8)
Globulin, Total: 2.6 g/dL (ref 2.2–3.9)
IgA/Immunoglobulin A, Serum: 103 mg/dL (ref 64–422)
IgG (Immunoglobin G), Serum: 860 mg/dL (ref 586–1602)
IgM (Immunoglobulin M), Srm: 30 mg/dL (ref 26–217)

## 2023-03-11 LAB — RPR: RPR Ser Ql: NONREACTIVE

## 2023-03-11 LAB — COMPREHENSIVE METABOLIC PANEL
ALT: 12 IU/L (ref 0–32)
AST: 12 IU/L (ref 0–40)
Albumin/Globulin Ratio: 1.8 (ref 1.2–2.2)
Albumin: 4 g/dL (ref 3.8–4.8)
Alkaline Phosphatase: 60 IU/L (ref 44–121)
BUN/Creatinine Ratio: 14 (ref 12–28)
BUN: 10 mg/dL (ref 8–27)
Bilirubin Total: 0.3 mg/dL (ref 0.0–1.2)
CO2: 25 mmol/L (ref 20–29)
Calcium: 9.1 mg/dL (ref 8.7–10.3)
Chloride: 106 mmol/L (ref 96–106)
Creatinine, Ser: 0.71 mg/dL (ref 0.57–1.00)
Globulin, Total: 2.2 g/dL (ref 1.5–4.5)
Glucose: 102 mg/dL — ABNORMAL HIGH (ref 70–99)
Potassium: 3.6 mmol/L (ref 3.5–5.2)
Sodium: 143 mmol/L (ref 134–144)
Total Protein: 6.2 g/dL (ref 6.0–8.5)
eGFR: 88 mL/min/{1.73_m2} (ref >59)

## 2023-03-11 LAB — SEDIMENTATION RATE: Sed Rate: 6 mm/hr (ref 0–40)

## 2023-03-11 LAB — HGB A1C W/O EAG: Hgb A1c MFr Bld: 5.3 % (ref 4.8–5.6)

## 2023-03-11 LAB — C-REACTIVE PROTEIN: CRP: 1 mg/L (ref 0–10)

## 2023-03-11 LAB — ANA W/REFLEX IF POSITIVE: Anti Nuclear Antibody (ANA): NEGATIVE

## 2023-03-11 LAB — VITAMIN B12: Vitamin B-12: 490 pg/mL (ref 232–1245)

## 2023-03-11 LAB — TSH: TSH: 1.25 u[IU]/mL (ref 0.450–4.500)

## 2023-06-01 ENCOUNTER — Ambulatory Visit: Payer: Medicare Other | Admitting: Podiatry

## 2023-06-11 ENCOUNTER — Ambulatory Visit
Admission: RE | Admit: 2023-06-11 | Discharge: 2023-06-11 | Disposition: A | Discharge status: Home / Self Care | Payer: Medicare Other | Source: Ambulatory Visit | Attending: Family Medicine | Admitting: Family Medicine

## 2023-06-11 ENCOUNTER — Other Ambulatory Visit: Payer: Self-pay | Admitting: Family Medicine

## 2023-06-11 DIAGNOSIS — M940 Chondrocostal junction syndrome [Tietze]: Secondary | ICD-10-CM

## 2023-07-01 ENCOUNTER — Ambulatory Visit: Payer: Medicare Other | Admitting: Podiatry

## 2023-07-10 ENCOUNTER — Ambulatory Visit (INDEPENDENT_AMBULATORY_CARE_PROVIDER_SITE_OTHER): Payer: Medicare Other | Admitting: Podiatry

## 2023-07-10 ENCOUNTER — Encounter: Payer: Self-pay | Admitting: Podiatry

## 2023-07-10 DIAGNOSIS — M79675 Pain in left toe(s): Secondary | ICD-10-CM | POA: Diagnosis not present

## 2023-07-10 DIAGNOSIS — M79674 Pain in right toe(s): Secondary | ICD-10-CM | POA: Diagnosis not present

## 2023-07-10 DIAGNOSIS — B351 Tinea unguium: Secondary | ICD-10-CM | POA: Diagnosis not present

## 2023-07-10 DIAGNOSIS — E1142 Type 2 diabetes mellitus with diabetic polyneuropathy: Secondary | ICD-10-CM | POA: Diagnosis not present

## 2023-07-10 NOTE — Progress Notes (Signed)
This patient returns to my office for at risk foot care.  This patient requires this care by a professional since this patient will be at risk due to having diabetic neuropathy.  This patient is unable to cut nails herself since the patient cannot reach her nails.These nails are painful walking and wearing shoes.  This patient presents for at risk foot care today.  General Appearance  Alert, conversant and in no acute stress.  Vascular  Dorsalis pedis and posterior tibial  pulses are palpable  bilaterally.  Capillary return is within normal limits  bilaterally. Temperature is within normal limits  bilaterally.  Neurologic  Senn-Weinstein monofilament wire test within normal limits  bilaterally. Muscle power within normal limits bilaterally.  Nails Thick disfigured discolored nails with subungual debris  from hallux to fifth toes bilaterally. No evidence of bacterial infection or drainage bilaterally.  Orthopedic  No limitations of motion  feet .  No crepitus or effusions noted.  No bony pathology or digital deformities noted. Significant  HAV  B/L.  Hammer toes  B/L.  Skin  normotropic skin with no porokeratosis noted bilaterally.  No signs of infections or ulcers noted.     Onychomycosis  Pain in right toes  Pain in left toes  Consent was obtained for treatment procedures.   Mechanical debridement of nails 1-5  bilaterally performed with a nail nipper.  Filed with dremel without incident.    Return office visit  3 months                    Told patient to return for periodic foot care and evaluation due to potential at risk complications.   Helane Gunther DPM

## 2023-10-02 ENCOUNTER — Ambulatory Visit: Payer: Medicare Other | Admitting: Podiatry

## 2023-10-30 ENCOUNTER — Encounter: Payer: Self-pay | Admitting: Podiatry

## 2023-10-30 ENCOUNTER — Ambulatory Visit (INDEPENDENT_AMBULATORY_CARE_PROVIDER_SITE_OTHER): Payer: Medicare Other | Admitting: Podiatry

## 2023-10-30 DIAGNOSIS — M79674 Pain in right toe(s): Secondary | ICD-10-CM

## 2023-10-30 DIAGNOSIS — B351 Tinea unguium: Secondary | ICD-10-CM | POA: Diagnosis not present

## 2023-10-30 DIAGNOSIS — E1142 Type 2 diabetes mellitus with diabetic polyneuropathy: Secondary | ICD-10-CM | POA: Diagnosis not present

## 2023-10-30 DIAGNOSIS — M79675 Pain in left toe(s): Secondary | ICD-10-CM | POA: Diagnosis not present

## 2023-10-30 NOTE — Progress Notes (Signed)
This patient returns to my office for at risk foot care.  This patient requires this care by a professional since this patient will be at risk due to having diabetic neuropathy.  This patient is unable to cut nails herself since the patient cannot reach her nails.These nails are painful walking and wearing shoes.  This patient presents for at risk foot care today.  General Appearance  Alert, conversant and in no acute stress.  Vascular  Dorsalis pedis and posterior tibial  pulses are palpable  bilaterally.  Capillary return is within normal limits  bilaterally. Temperature is within normal limits  bilaterally.  Neurologic  Senn-Weinstein monofilament wire test within normal limits  bilaterally. Muscle power within normal limits bilaterally.  Nails Thick disfigured discolored nails with subungual debris  from hallux to fifth toes bilaterally. No evidence of bacterial infection or drainage bilaterally.  Orthopedic  No limitations of motion  feet .  No crepitus or effusions noted.  No bony pathology or digital deformities noted. Significant  HAV  B/L.  Hammer toes  B/L.  Skin  normotropic skin with no porokeratosis noted bilaterally.  No signs of infections or ulcers noted.     Onychomycosis  Pain in right toes  Pain in left toes  Consent was obtained for treatment procedures.   Mechanical debridement of nails 1-5  bilaterally performed with a nail nipper.  Filed with dremel without incident.    Return office visit  3 months                    Told patient to return for periodic foot care and evaluation due to potential at risk complications.   Helane Gunther DPM

## 2023-11-19 ENCOUNTER — Other Ambulatory Visit: Payer: Self-pay

## 2023-11-19 ENCOUNTER — Ambulatory Visit: Payer: Medicare PPO | Attending: Family Medicine

## 2023-11-19 DIAGNOSIS — M6281 Muscle weakness (generalized): Secondary | ICD-10-CM | POA: Diagnosis present

## 2023-11-19 DIAGNOSIS — R293 Abnormal posture: Secondary | ICD-10-CM | POA: Diagnosis present

## 2023-11-19 DIAGNOSIS — M5459 Other low back pain: Secondary | ICD-10-CM | POA: Diagnosis present

## 2023-11-19 NOTE — Progress Notes (Signed)
 OUTPATIENT PHYSICAL THERAPY THORACOLUMBAR EVALUATION   Patient Name: Ashlee Wong  MRN: [id number] DOB:January 25, 1946, 78 y.o., female Today's Date: 11/19/2023  END OF SESSION:  PT End of Session - 11/19/23 1508     Visit Number 1    Number of Visits 7    Date for PT Re-Evaluation 12/31/23    Authorization Type [employer] MCR    Authorization Time Period Initial Auth submitted    PT Start Time 1355    PT Stop Time 1445    PT Time Calculation (min) 50 min    Activity Tolerance Patient tolerated treatment well    Behavior During Therapy [facility] for tasks assessed/performed             Past Medical History:  Diagnosis Date   Glaucoma    Hypertension    Snoring 08/07/2020   Type 2 diabetes mellitus ([facility])    Past Surgical History:  Procedure Laterality Date   ABDOMINAL HYSTERECTOMY     CATARACT EXTRACTION, BILATERAL     Patient Active Problem List   Diagnosis Date Noted   Neuropathic pain 03/05/2023   Neuropathy 11/11/2022   Snoring 08/07/2020   Sleep apnea 07/22/2012    PCP: Benjamine Aland, MD  REFERRING PROVIDER: Benjamine Aland, MD  REFERRING DIAG: M54.9 (ICD-10-CM) - Dorsalgia   Rationale for Evaluation and Treatment: Rehabilitation  THERAPY DIAG:  Abnormal posture  Muscle weakness (generalized)  Other low back pain  ONSET DATE: October 2024  SUBJECTIVE:                                                                                                                                                                                           SUBJECTIVE STATEMENT: Pt is a 78 y.o. female arriving to PT with a c/c of LBP, stiffness, and, postural concerns. Pt. reports pain with lifting and concerns about forward lean when sitting and walking. Pt. describes pain as sharp and expresses numbness and tingling in R lateral thigh on occasion. Pt. reports aching and coldness in the feet at night and unusual weight loss which MD is aware of. 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 Pt. denies bowel and bladder changes and saddle anesthesia.   PERTINENT HISTORY:  Type 2 diabetes GERD   PAIN:  Are you having pain? Yes: NPRS scale: 8/10 worst, 5/10 avg. Pain location: Back (thoracic and lumbar) Pain description: Sharp  Aggravating factors: Lifting Relieving factors: Rest   PRECAUTIONS: Fall  RED FLAGS: Unexplained weight loss    WEIGHT BEARING RESTRICTIONS: No  FALLS:  Has patient fallen in last 6  months? Yes. Number of falls 1 - feel in August on apartment stairs   LIVING ENVIRONMENT: Lives with: lives alone Lives in: House/apartment Stairs: Yes: External: 15 steps; on right going up Has following equipment at home: None  OCCUPATION: Retired.   PLOF: Independent with basic ADLs  PATIENT GOALS: Pt. would like to work on postural control and decrease lower back pain to return to ADLs and leisure activities including light aerobic exercise, housework, and sleeping better.   OBJECTIVE:  Note: Objective measures were completed at Evaluation unless otherwise noted.  PATIENT SURVEYS:  FOTO 53%, predicted 58%   COGNITION: Overall cognitive status: Within functional limits for tasks assessed     SENSATION: WFL  MUSCLE LENGTH: Hamstrings: WNL   POSTURE: rounded shoulders, forward head, and increased thoracic kyphosis  PALPATION: Palpation of spine, paraspinals, and traps. No tenderness noted.   LUMBAR ROM:   AROM eval  Flexion WNL   Extension 75%  Right lateral flexion   Left lateral flexion   Right rotation WNL  Left rotation WNL    (Blank rows = not tested)  LOWER EXTREMITY ROM:     Active  Right eval Left eval  Hip flexion    Hip extension    Hip abduction    Hip adduction    Hip internal rotation    Hip external rotation    Knee flexion    Knee extension    Ankle dorsiflexion    Ankle plantarflexion    Ankle inversion    Ankle eversion     (Blank rows = not tested)  LOWER  EXTREMITY MMT:    MMT Right eval Left eval  Hip flexion 3+ 3+  Hip extension    Hip abduction 3+ 3+  Hip adduction    Hip internal rotation    Hip external rotation    Knee flexion    Knee extension    Ankle dorsiflexion    Ankle plantarflexion    Ankle inversion    Ankle eversion     (Blank rows = not tested)  LUMBAR SPECIAL TESTS:  Slump test: Tightness described in right hip moving to lateral knee.   FUNCTIONAL TESTS:  30 seconds chair stand test: 11 reps  GAIT: Distance walked: 40 ft Assistive device utilized: None Level of assistance: Complete Independence Comments:  Pt. able to ambulate on own, decrease gait speed, decreased heel strike, decreased knee flexion during swing.    TREATMENT DATE: 11/19/23 Therapeutic Exercise:  Bilateral ER x5 Scapular Squeezes x5 Supine pelvic tilts x5 Supine bridges x5                                                                                                                        PATIENT EDUCATION:  Education details: Exam findings, POC, HEP, FOTO, posture Person educated: Patient Education method: Explanation, Demonstration, Tactile cues, Verbal cues, and Handouts Education comprehension: verbalized understanding, returned demonstration, and needs further education  HOME EXERCISE PROGRAM: Access Code: [email or web address]  ASSESSMENT:  CLINICAL IMPRESSION: Patient is a 78 y.o. female who was seen today for physical therapy evaluation and treatment for chronic low back pain with referral into right lower extremity and postural deficits. Physical findings are consistent with referring MD impression as patient demonstrates increased thoracic kyphotic posture, decreased strength in abdominal and gluteal muscles, and decreased pelvic motor control. FOTO score demonstrates decrease in subjective functional ability indicating a decrease from PLOF. Patient would benefit from skilled PT to improve strength and motor control for community  ambulation and home ADLs with decreases in back pain.   OBJECTIVE IMPAIRMENTS: decreased strength, postural dysfunction, and pain.   ACTIVITY LIMITATIONS: carrying, lifting, sleeping, and locomotion level  PARTICIPATION LIMITATIONS: cleaning, community activity, and church  PERSONAL FACTORS: Age, Past/current experiences, and Time since onset of injury/illness/exacerbation are also affecting patient's functional outcome.   REHAB POTENTIAL: Good  CLINICAL DECISION MAKING: Stable/uncomplicated  EVALUATION COMPLEXITY: Low   GOALS: Goals reviewed with patient? Yes  SHORT TERM GOALS: Target date: 12/03/2023  Patient will be independent with initial HEP in order to progress with therapy.    Baseline: 11/19/2023 Goal status: INITIAL  2. Patient will rate pain on NPS no greater than a 4/10 for improvement in comfort and functional ability in home ADLs.   Baseline: 8/10  Goal status: INITIAL  LONG TERM GOALS: Target date: 12/31/2023  Patient will be I with final HEP in order to continue carry over post discharge.  Baseline: Goal status: INITIAL  2.  Pt will score a 58% on their FOTO to show an improvement in functional activities at home and in community with decreased back pain. Baseline: 53% Goal status: INITIAL  3.  Patient will be able to lift 7 lbs independently with no reported back pain after to participate in regular household activities.  Baseline: body weight Goal status: INITIAL  4. Patient will be able to complete at least 13 reps during the 30 second sit to stand to demonstrate improvement in functional mobility.  Baseline: 11   Goal status: INITIAL  5. Patient will score a 4/5 or greater on all LE MMT to demonstrate an increase in strength for decreasing lower back pain.  Baseline: 3+/5   Goal status: INITIAL PLAN:  PT FREQUENCY: 1x/week  PT DURATION: 6 weeks  PLANNED INTERVENTIONS: 97164- PT Re-evaluation, 97110-Therapeutic exercises, 97530- Therapeutic  activity, [id number]- Neuromuscular re-education, 97535- Self Care, [id number]- Manual therapy, [id number]- Gait training, Patient/Family education, and postural training .  PLAN FOR NEXT SESSION: Review and revise HEP, continue to work on pelvic tilting and postural control.    Kent Hummer, Student-PT 11/19/2023, 4:09 PM

## 2023-12-01 ENCOUNTER — Encounter: Payer: Medicare PPO | Admitting: Physical Therapy

## 2023-12-03 ENCOUNTER — Ambulatory Visit: Payer: Medicare PPO | Admitting: Physical Therapy

## 2023-12-03 ENCOUNTER — Encounter: Payer: Self-pay | Admitting: Physical Therapy

## 2023-12-03 DIAGNOSIS — R293 Abnormal posture: Secondary | ICD-10-CM | POA: Diagnosis not present

## 2023-12-03 DIAGNOSIS — M6281 Muscle weakness (generalized): Secondary | ICD-10-CM

## 2023-12-03 DIAGNOSIS — M5459 Other low back pain: Secondary | ICD-10-CM

## 2023-12-03 NOTE — Therapy (Addendum)
OUTPATIENT PHYSICAL THERAPY THORACOLUMBAR TREATMENT   Patient Name: Ashlee Wong MRN: [id number] DOB:[birth year], 78 y.o., female Today's Date: 12/03/2023  END OF SESSION:  PT End of Session - 12/03/23 1231     Visit Number 2    Number of Visits 7    Date for PT Re-Evaluation 12/31/23    Authorization Type [employer] MCR    PT Start Time 1150    PT Stop Time 1230    PT Time Calculation (min) 40 min    Activity Tolerance Patient limited by pain;Patient tolerated treatment well    Behavior During Therapy [facility] for tasks assessed/performed             Past Medical History:  Diagnosis Date   Glaucoma    Hypertension    Snoring 08/07/2020   Type 2 diabetes mellitus [facility])    Past Surgical History:  Procedure Laterality Date   ABDOMINAL HYSTERECTOMY     CATARACT EXTRACTION, BILATERAL     Patient Active Problem List   Diagnosis Date Noted   Neuropathic pain 03/05/2023   Neuropathy 11/11/2022   Snoring 08/07/2020   Sleep apnea 07/22/2012    PCP: Renaye Rakers, MD  REFERRING PROVIDER: Renaye Rakers, MD  REFERRING DIAG: M54.9 (ICD-10-CM) - Dorsalgia   Rationale for Evaluation and Treatment: Rehabilitation  THERAPY DIAG:  Abnormal posture  Muscle weakness (generalized)  Other low back pain  ONSET DATE: October 2024  SUBJECTIVE:                                                                                                                                                                                           SUBJECTIVE STATEMENT: Patient reports to PT with complaints of back pain still in the thoracic region mostly. Patient has been compliant with HEP at home. She has not been feeling any of the numbness or tingling down her right leg. Reports sleeping has been improving as well.   EVAL: Pt is a 78 y.o. female arriving to PT with a c/c of LBP, stiffness, and, postural concerns. Pt. reports pain with lifting and concerns about forward lean when sitting and walking.  Pt. describes pain as sharp and expresses numbness and tingling in R lateral thigh on occasion. Pt. reports aching and coldness in the feet at night and unusual weight loss which MD is aware of. 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 Pt. denies bowel and bladder changes and saddle anesthesia.   PERTINENT HISTORY:  Type 2 diabetes  GERD  PAIN:  Are you having pain? Yes: NPRS scale: 8/10 worse, 5/10 average Pain location:  Back (thoracic and lumbar) Pain description: sharp Aggravating factors: lifting Relieving factors: rest  PRECAUTIONS: Fall  RED FLAGS: Unexplained weight loss    WEIGHT BEARING RESTRICTIONS: No  FALLS:  Has patient fallen in last 6 months? Yes. Number of falls 1 - fell in August on apartment stairs  LIVING ENVIRONMENT: Lives with: lives with their family and lives alone Lives in: House/apartment Stairs: Yes: External: 15 steps; on right going up Has following equipment at home: None  OCCUPATION: Retired  PLOF: Independent with basic ADLs  PATIENT GOALS: Pt. would like to work on postural control and decrease lower back pain to return to ADLs and leisure activities including light aerobic exercise, housework, and sleeping better.   OBJECTIVE:  Note: Objective measures were completed at Evaluation unless otherwise noted.  PATIENT SURVEYS:  FOTO 53%, PREDICTED 58%  COGNITION: Overall cognitive status: Within functional limits for tasks assessed     SENSATION: WFL  POSTURE: rounded shoulders, forward head, and increased thoracic kyphosis  PALPATION: Palpation of spine, paraspinals, and traps. No tenderness noted.   LUMBAR ROM:   AROM eval  Flexion WNL  Extension 75%  Right lateral flexion   Left lateral flexion   Right rotation WNL  Left rotation WNL   (Blank rows = not tested)  LOWER EXTREMITY ROM:     Active  Right eval Left eval  Hip flexion    Hip extension    Hip abduction    Hip adduction    Hip  internal rotation    Hip external rotation    Knee flexion    Knee extension    Ankle dorsiflexion    Ankle plantarflexion    Ankle inversion    Ankle eversion     (Blank rows = not tested)  LOWER EXTREMITY MMT:    MMT Right eval Left eval  Hip flexion 3+ 3+  Hip extension    Hip abduction 3+ 3+  Hip adduction    Hip internal rotation    Hip external rotation    Knee flexion    Knee extension    Ankle dorsiflexion    Ankle plantarflexion    Ankle inversion    Ankle eversion     (Blank rows = not tested)  LUMBAR SPECIAL TESTS:  Slump test: Tightness described in right hip moving to lateral knee  FUNCTIONAL TESTS:  30 seconds chair stand test: 11 reps  GAIT: Distance walked: Distance walked: 40 ft Assistive device utilized: None Level of assistance: Complete Independence Comments:  Pt. able to ambulate on own, decrease gait speed, decreased heel strike, decreased knee flexion during swing.    TREATMENT DATE:  12/03/2023 Therapeutic Exercise:  NuStep lvl 1 x 3 minutes arms and legs  Sidelying thoracic extension 1x10x3" each  Seated thoracic extension 1x10x3" each  Supine bridges with YTB 2x10  Sidelying clamshells YTB at knees 2x10  Bilateral ER RTB 2x10  SLR with pilates ring 2x10  Standing row 2x10 YTB  Standing marches at counter 2x10 Manual Therapy:  STM to R low trap  11/19/2023 Therapeutic Exercise:  Bilateral ER x5 Scapular Squeezes x5 Supine pelvic tilts x5 Supine bridges x5                                                 PATIENT EDUCATION:  Education details: Exam findings, POC, HEP, FOTO, posture Person  educated: Patient Education method: Explanation, Demonstration, Tactile cues, Verbal cues, and Handouts Education comprehension: verbalized understanding, returned demonstration, and needs further education  HOME EXERCISE PROGRAM: Access Code: [email or web address]  ASSESSMENT:  CLINICAL IMPRESSION: Patient responded to therapy well with no adverse  effects. Continues to show a decrease in strength of abdominal and gluteal muscles as well as a decrease in pelvic control. HEP revised during today session to work on postural control. Patient would benefit from continued skilled PT to improve strength and motor control for a decrease in pain, increase in postural control, and community ambulation.   EVAL: Patient is a 78 y.o. female who was seen today for physical therapy evaluation and treatment for chronic low back pain with referral into right lower extremity and postural deficits. Physical findings are consistent with referring MD impression as patient demonstrates increased thoracic kyphotic posture, decreased strength in abdominal and gluteal muscles, and decreased pelvic motor control. FOTO score demonstrates decrease in subjective functional ability indicating a decrease from PLOF. Patient would benefit from skilled PT to improve strength and motor control for community ambulation and home ADLs with decreases in back pain.   OBJECTIVE IMPAIRMENTS: decreased strength, postural dysfunction, and pain.    ACTIVITY LIMITATIONS: carrying, lifting, sleeping, and locomotion level   PARTICIPATION LIMITATIONS: cleaning, community activity, and church   PERSONAL FACTORS: Age, Past/current experiences, and Time since onset of injury/illness/exacerbation are also affecting patient's functional outcome.    REHAB POTENTIAL: Good   CLINICAL DECISION MAKING: Stable/uncomplicated   EVALUATION COMPLEXITY: Low   GOALS: Goals reviewed with patient? Yes   SHORT TERM GOALS: Target date: 12/03/2023   Patient will be independent with initial HEP in order to progress with therapy.                                  Baseline: 11/19/2023 Goal status: INITIAL   2. Patient will rate pain on NPS no greater than a 4/10 for improvement in comfort and functional ability in home ADLs.             Baseline: 8/10            Goal status: INITIAL   LONG TERM GOALS:  Target date: 12/31/2023   Patient will be I with final HEP in order to continue carry over post discharge.  Baseline: Goal status: INITIAL   2.  Pt will score a 58% on their FOTO to show an improvement in functional activities at home and in community with decreased back pain. Baseline: 53% Goal status: INITIAL   3.  Patient will be able to lift 7 lbs independently with no reported back pain after to participate in regular household activities.  Baseline: body weight Goal status: INITIAL   4. Patient will be able to complete at least 13 reps during the 30 second sit to stand to demonstrate improvement in functional mobility.            Baseline: 11             Goal status: INITIAL   5. Patient will score a 4/5 or greater on all LE MMT to demonstrate an increase in strength for decreasing lower back pain.            Baseline: 3+/5             Goal status: INITIAL PLAN:   PT FREQUENCY: 1x/week   PT DURATION: 6 weeks   PLANNED  INTERVENTIONS: 97164- PT Re-evaluation, 97110-Therapeutic exercises, 97530- Therapeutic activity, [id number]- Neuromuscular re-education, 97535- Self Care, [id number]- Manual therapy, [phone]- Gait training, Patient/Family education, and postural training .   PLAN FOR NEXT SESSION: Review and revise HEP, continue to work on pelvic tilting and postural control but seated and standing.   Erin Hearing, Student-PT 12/03/2023, 12:53 PM

## 2023-12-08 ENCOUNTER — Ambulatory Visit: Payer: Medicare PPO

## 2023-12-14 ENCOUNTER — Other Ambulatory Visit: Payer: Self-pay | Admitting: *Deleted

## 2023-12-14 DIAGNOSIS — R209 Unspecified disturbances of skin sensation: Secondary | ICD-10-CM

## 2023-12-24 ENCOUNTER — Ambulatory Visit: Payer: Medicare PPO | Attending: Family Medicine

## 2023-12-24 DIAGNOSIS — R293 Abnormal posture: Secondary | ICD-10-CM | POA: Insufficient documentation

## 2023-12-24 DIAGNOSIS — M5459 Other low back pain: Secondary | ICD-10-CM | POA: Diagnosis present

## 2023-12-24 DIAGNOSIS — M6281 Muscle weakness (generalized): Secondary | ICD-10-CM | POA: Diagnosis present

## 2023-12-24 NOTE — Therapy (Addendum)
 OUTPATIENT PHYSICAL THERAPY TREATMENT  discharge   Patient Name: Ashlee Wong MRN: [id number] DOB:20-Feb-1946, 78 y.o., female Today's Date: 12/24/2023  END OF SESSION:  PT End of Session - 12/24/23 1327     Visit Number 3    Number of Visits 7    Date for PT Re-Evaluation 12/31/23    Authorization Type [employer] MCR    Authorization Time Period Approved 12 visits 12/03/23-01/16/24    Authorization - Visit Number 2    Authorization - Number of Visits 12    PT Start Time 1400    Activity Tolerance Patient limited by pain;Patient tolerated treatment well    Behavior During Therapy [facility] for tasks assessed/performed             Past Medical History:  Diagnosis Date   Glaucoma    Hypertension    Snoring 08/07/2020   Type 2 diabetes mellitus [facility])    Past Surgical History:  Procedure Laterality Date   ABDOMINAL HYSTERECTOMY     CATARACT EXTRACTION, BILATERAL     Patient Active Problem List   Diagnosis Date Noted   Neuropathic pain 03/05/2023   Neuropathy 11/11/2022   Snoring 08/07/2020   Sleep apnea 07/22/2012    PCP: Renaye Rakers, MD  REFERRING PROVIDER: Renaye Rakers, MD  REFERRING DIAG: M54.9 (ICD-10-CM) - Dorsalgia   Rationale for Evaluation and Treatment: Rehabilitation  THERAPY DIAG:  No diagnosis found.  ONSET DATE: October 2024  SUBJECTIVE:                                                                                                                                                                                           SUBJECTIVE STATEMENT: Pt presents to PT with reports of continued mid/low back pain. Has been compliant with HEP.   EVAL: Pt is a 78 y.o. female arriving to PT with a c/c of LBP, stiffness, and, postural concerns. Pt. reports pain with lifting and concerns about forward lean when sitting and walking. Pt. describes pain as sharp and expresses numbness and tingling in R lateral thigh on occasion. Pt. reports aching and coldness in  the feet at night and unusual weight loss which MD is aware of. 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 Pt. denies bowel and bladder changes and saddle anesthesia.   PERTINENT HISTORY:  Type 2 diabetes  GERD  PAIN:  Are you having pain?  Yes: NPRS scale: 5/10 Worst: 8/10 Pain location: Back (thoracic and lumbar) Pain description: sharp Aggravating factors: lifting Relieving factors: rest  PRECAUTIONS: Fall  RED FLAGS: Unexplained weight loss  WEIGHT BEARING RESTRICTIONS: No  FALLS:  Has patient fallen in last 6 months? Yes. Number of falls 1 - fell in August on apartment stairs  LIVING ENVIRONMENT: Lives with: lives with their family and lives alone Lives in: House/apartment Stairs: Yes: External: 15 steps; on right going up Has following equipment at home: None  OCCUPATION: Retired  PLOF: Independent with basic ADLs  PATIENT GOALS: Pt. would like to work on postural control and decrease lower back pain to return to ADLs and leisure activities including light aerobic exercise, housework, and sleeping better.   OBJECTIVE:  Note: Objective measures were completed at Evaluation unless otherwise noted.  PATIENT SURVEYS:  FOTO 53%, PREDICTED 58%  COGNITION: Overall cognitive status: Within functional limits for tasks assessed     SENSATION: WFL  POSTURE: rounded shoulders, forward head, and increased thoracic kyphosis  PALPATION: Palpation of spine, paraspinals, and traps. No tenderness noted.   LUMBAR ROM:   AROM eval  Flexion WNL  Extension 75%  Right lateral flexion   Left lateral flexion   Right rotation WNL  Left rotation WNL   (Blank rows = not tested)  LOWER EXTREMITY ROM:     Active  Right eval Left eval  Hip flexion    Hip extension    Hip abduction    Hip adduction    Hip internal rotation    Hip external rotation    Knee flexion    Knee extension    Ankle dorsiflexion    Ankle plantarflexion     Ankle inversion    Ankle eversion     (Blank rows = not tested)  LOWER EXTREMITY MMT:    MMT Right eval Left eval  Hip flexion 3+ 3+  Hip extension    Hip abduction 3+ 3+  Hip adduction    Hip internal rotation    Hip external rotation    Knee flexion    Knee extension    Ankle dorsiflexion    Ankle plantarflexion    Ankle inversion    Ankle eversion     (Blank rows = not tested)  LUMBAR SPECIAL TESTS:  Slump test: Tightness described in right hip moving to lateral knee  FUNCTIONAL TESTS:  30 seconds chair stand test: 11 reps  GAIT: Distance walked: Distance walked: 40 ft Assistive device utilized: None Level of assistance: Complete Independence Comments:  Pt. able to ambulate on own, decrease gait speed, decreased heel strike, decreased knee flexion during swing.    TODAY'S TREATMENT DATE:  Therapeutic Exercise:  NuStep lvl 4 UE/LE x 3 min while taking subjective Standing row 2x10 RTB Seated bilateral ER 2x10 RTB Seated horizontal abd 2x10 RTB Seated thoracic ext over soft foam 2x10 - 5" hold Bridge 3x10 Supine pilates SLR 2x10  Supine clamshell 2x15 GTB Supine march 2x20 GTB STS 2x10 - low table Seated low row 2x10 15#  PATIENT EDUCATION:  Education details: continue HEP Person educated: Patient Education method: Programmer, multimedia, Demonstration, Tactile cues, Verbal cues, and Handouts Education comprehension: verbalized understanding, returned demonstration, and needs further education  HOME EXERCISE PROGRAM: Access Code: [email or web address]  ASSESSMENT:  CLINICAL IMPRESSION: Pt was able to complete prescribed exercises with no adverse effect. Therapy focused today on improving core/hip strength as well as periscapular strength in order to decrease pain and improve posture. No changes to HEP made this session. Pt progressing with therapy, will continue per POC.   EVAL: Patient is a 78 y.o. female who was seen today for physical therapy evaluation and treatment  for  chronic low back pain with referral into right lower extremity and postural deficits. Physical findings are consistent with referring MD impression as patient demonstrates increased thoracic kyphotic posture, decreased strength in abdominal and gluteal muscles, and decreased pelvic motor control. FOTO score demonstrates decrease in subjective functional ability indicating a decrease from PLOF. Patient would benefit from skilled PT to improve strength and motor control for community ambulation and home ADLs with decreases in back pain.   OBJECTIVE IMPAIRMENTS: decreased strength, postural dysfunction, and pain.    ACTIVITY LIMITATIONS: carrying, lifting, sleeping, and locomotion level   PARTICIPATION LIMITATIONS: cleaning, community activity, and church   PERSONAL FACTORS: Age, Past/current experiences, and Time since onset of injury/illness/exacerbation are also affecting patient's functional outcome.    REHAB POTENTIAL: Good   CLINICAL DECISION MAKING: Stable/uncomplicated   EVALUATION COMPLEXITY: Low   GOALS: Goals reviewed with patient? Yes   SHORT TERM GOALS: Target date: 12/03/2023   Patient will be independent with initial HEP in order to progress with therapy.                                  Baseline: 11/19/2023 Goal status: INITIAL   2. Patient will rate pain on NPS no greater than a 4/10 for improvement in comfort and functional ability in home ADLs.             Baseline: 8/10            Goal status: INITIAL   LONG TERM GOALS: Target date: 12/31/2023   Patient will be I with final HEP in order to continue carry over post discharge.  Baseline: Goal status: INITIAL   2.  Pt will score a 58% on their FOTO to show an improvement in functional activities at home and in community with decreased back pain. Baseline: 53% Goal status: INITIAL   3.  Patient will be able to lift 7 lbs independently with no reported back pain after to participate in regular household activities.   Baseline: body weight Goal status: INITIAL   4. Patient will be able to complete at least 13 reps during the 30 second sit to stand to demonstrate improvement in functional mobility.            Baseline: 11             Goal status: INITIAL   5. Patient will score a 4/5 or greater on all LE MMT to demonstrate an increase in strength for decreasing lower back pain.            Baseline: 3+/5             Goal status: INITIAL PLAN:   PT FREQUENCY: 1x/week   PT DURATION: 6 weeks   PLANNED INTERVENTIONS: 97164- PT Re-evaluation, 97110-Therapeutic exercises, 97530- Therapeutic activity, [id number]- Neuromuscular re-education, 97535- Self Care, 16109- Manual therapy, [phone]- Gait training, Patient/Family education, and postural training .   PLAN FOR NEXT SESSION: Review and revise HEP, continue to work on pelvic tilting and postural control but seated and standing.   Eloy End, PT 12/24/2023, 1:29 PM    PHYSICAL THERAPY DISCHARGE SUMMARY  Visits from Start of Care: 3  Current functional level related to goals / functional outcomes: See above   Remaining deficits: See above   Education / Equipment: HEP   Patient agrees to discharge. Patient goals were not met. Patient is being discharged due to not returning since  the last visit.  Rosana Hoes, PT, DPT, LAT, ATC 01/20/24  3:59 PM Phone: [phone] Fax: [phone]

## 2023-12-25 ENCOUNTER — Ambulatory Visit: Payer: Medicare PPO

## 2023-12-25 ENCOUNTER — Ambulatory Visit (HOSPITAL_COMMUNITY): Payer: Medicare PPO

## 2023-12-31 ENCOUNTER — Ambulatory Visit: Payer: Medicare PPO

## 2024-01-07 ENCOUNTER — Ambulatory Visit: Payer: Medicare PPO | Admitting: Physical Therapy

## 2024-01-14 ENCOUNTER — Ambulatory Visit: Payer: Medicare PPO | Admitting: Physical Therapy

## 2024-08-31 ENCOUNTER — Ambulatory Visit: Admitting: Podiatry

## 2024-09-05 ENCOUNTER — Ambulatory Visit: Admitting: Podiatry

## 2024-09-19 ENCOUNTER — Ambulatory Visit (INDEPENDENT_AMBULATORY_CARE_PROVIDER_SITE_OTHER): Admitting: Podiatry

## 2024-09-19 ENCOUNTER — Encounter: Payer: Self-pay | Admitting: Podiatry

## 2024-09-19 DIAGNOSIS — M79675 Pain in left toe(s): Secondary | ICD-10-CM | POA: Diagnosis not present

## 2024-09-19 DIAGNOSIS — M79674 Pain in right toe(s): Secondary | ICD-10-CM | POA: Diagnosis not present

## 2024-09-19 DIAGNOSIS — B351 Tinea unguium: Secondary | ICD-10-CM

## 2024-09-19 DIAGNOSIS — E1142 Type 2 diabetes mellitus with diabetic polyneuropathy: Secondary | ICD-10-CM

## 2024-09-19 NOTE — Progress Notes (Signed)
This patient returns to my office for at risk foot care.  This patient requires this care by a professional since this patient will be at risk due to having diabetic neuropathy.  This patient is unable to cut nails herself since the patient cannot reach her nails.These nails are painful walking and wearing shoes.  This patient presents for at risk foot care today.  General Appearance  Alert, conversant and in no acute stress.  Vascular  Dorsalis pedis and posterior tibial  pulses are palpable  bilaterally.  Capillary return is within normal limits  bilaterally. Temperature is within normal limits  bilaterally.  Neurologic  Senn-Weinstein monofilament wire test within normal limits  bilaterally. Muscle power within normal limits bilaterally.  Nails Thick disfigured discolored nails with subungual debris  from hallux to fifth toes bilaterally. No evidence of bacterial infection or drainage bilaterally.  Orthopedic  No limitations of motion  feet .  No crepitus or effusions noted.  No bony pathology or digital deformities noted. Significant  HAV  B/L.  Hammer toes  B/L.  Skin  normotropic skin with no porokeratosis noted bilaterally.  No signs of infections or ulcers noted.     Onychomycosis  Pain in right toes  Pain in left toes  Consent was obtained for treatment procedures.   Mechanical debridement of nails 1-5  bilaterally performed with a nail nipper.  Filed with dremel without incident.    Return office visit  3 months                    Told patient to return for periodic foot care and evaluation due to potential at risk complications.   Helane Gunther DPM

## 2024-09-20 DIAGNOSIS — H401133 Primary open-angle glaucoma, bilateral, severe stage: Secondary | ICD-10-CM | POA: Diagnosis not present

## 2024-12-14 ENCOUNTER — Encounter: Payer: Self-pay | Admitting: *Deleted

## 2024-12-14 NOTE — Progress Notes (Signed)
 Ashlee Wong                                          MRN: [id number]   12/14/2024   The [facility] Quality Team Specialist reviewed this patient medical record for the purposes of chart review for care gap closure. The following were reviewed: chart review for care gap closure-controlling blood pressure.    [facility] Quality Team

## 2024-12-20 ENCOUNTER — Ambulatory Visit (INDEPENDENT_AMBULATORY_CARE_PROVIDER_SITE_OTHER): Admitting: Podiatry
